# Patient Record
Sex: Female | Born: 1935 | Race: Black or African American | Hispanic: No | Marital: Married | Smoking: Never smoker
Health system: Southern US, Community
[De-identification: ages and names within clinical notes are randomized; demographics above are authoritative.]

## PROBLEM LIST (undated history)

## (undated) DIAGNOSIS — N183 Chronic kidney disease, stage 3 unspecified: Secondary | ICD-10-CM

## (undated) DIAGNOSIS — M1990 Unspecified osteoarthritis, unspecified site: Secondary | ICD-10-CM

## (undated) DIAGNOSIS — R011 Cardiac murmur, unspecified: Secondary | ICD-10-CM

## (undated) DIAGNOSIS — E119 Type 2 diabetes mellitus without complications: Secondary | ICD-10-CM

## (undated) DIAGNOSIS — C801 Malignant (primary) neoplasm, unspecified: Secondary | ICD-10-CM

## (undated) DIAGNOSIS — C499 Malignant neoplasm of connective and soft tissue, unspecified: Secondary | ICD-10-CM

## (undated) DIAGNOSIS — G4730 Sleep apnea, unspecified: Secondary | ICD-10-CM

## (undated) DIAGNOSIS — F0390 Unspecified dementia without behavioral disturbance: Secondary | ICD-10-CM

## (undated) DIAGNOSIS — R600 Localized edema: Secondary | ICD-10-CM

## (undated) DIAGNOSIS — I10 Essential (primary) hypertension: Secondary | ICD-10-CM

## (undated) DIAGNOSIS — E785 Hyperlipidemia, unspecified: Secondary | ICD-10-CM

## (undated) HISTORY — PX: CATARACT EXTRACTION: SUR2

## (undated) HISTORY — PX: COLONOSCOPY: SHX174

## (undated) HISTORY — PX: ABDOMINAL HYSTERECTOMY: SHX81

## (undated) HISTORY — PX: JOINT REPLACEMENT: SHX530

## (undated) HISTORY — PX: TONSILLECTOMY: SUR1361

## (undated) HISTORY — PX: CHOLECYSTECTOMY: SHX55

---

## 2004-05-11 ENCOUNTER — Ambulatory Visit: Payer: Self-pay | Admitting: Internal Medicine

## 2004-08-11 ENCOUNTER — Ambulatory Visit: Payer: Self-pay | Admitting: Gastroenterology

## 2004-11-08 ENCOUNTER — Ambulatory Visit: Payer: Self-pay | Admitting: Internal Medicine

## 2004-12-01 ENCOUNTER — Ambulatory Visit: Payer: Self-pay | Admitting: Internal Medicine

## 2005-06-14 ENCOUNTER — Ambulatory Visit: Payer: Self-pay | Admitting: Internal Medicine

## 2005-06-20 ENCOUNTER — Ambulatory Visit: Payer: Self-pay | Admitting: Internal Medicine

## 2005-07-06 ENCOUNTER — Other Ambulatory Visit: Payer: Self-pay

## 2005-07-06 ENCOUNTER — Ambulatory Visit: Payer: Self-pay | Admitting: General Surgery

## 2005-07-08 ENCOUNTER — Emergency Department: Payer: Self-pay | Admitting: General Practice

## 2005-07-13 ENCOUNTER — Ambulatory Visit: Payer: Self-pay | Admitting: General Surgery

## 2005-07-28 ENCOUNTER — Ambulatory Visit: Payer: Self-pay | Admitting: Internal Medicine

## 2005-08-07 ENCOUNTER — Ambulatory Visit: Payer: Self-pay | Admitting: Internal Medicine

## 2005-08-30 ENCOUNTER — Ambulatory Visit: Payer: Self-pay | Admitting: Internal Medicine

## 2005-09-30 ENCOUNTER — Ambulatory Visit: Payer: Self-pay | Admitting: Internal Medicine

## 2005-10-30 ENCOUNTER — Ambulatory Visit: Payer: Self-pay | Admitting: Internal Medicine

## 2006-01-05 ENCOUNTER — Ambulatory Visit: Payer: Self-pay | Admitting: Internal Medicine

## 2006-01-08 ENCOUNTER — Ambulatory Visit: Payer: Self-pay | Admitting: General Surgery

## 2006-01-30 ENCOUNTER — Ambulatory Visit: Payer: Self-pay | Admitting: Internal Medicine

## 2006-05-01 ENCOUNTER — Ambulatory Visit: Payer: Self-pay | Admitting: Internal Medicine

## 2006-05-04 ENCOUNTER — Ambulatory Visit: Payer: Self-pay | Admitting: Internal Medicine

## 2006-05-31 ENCOUNTER — Ambulatory Visit: Payer: Self-pay | Admitting: Internal Medicine

## 2006-07-25 ENCOUNTER — Ambulatory Visit: Payer: Self-pay | Admitting: General Surgery

## 2006-10-31 ENCOUNTER — Ambulatory Visit: Payer: Self-pay | Admitting: Internal Medicine

## 2006-11-19 ENCOUNTER — Ambulatory Visit: Payer: Self-pay | Admitting: Radiation Oncology

## 2006-12-01 ENCOUNTER — Ambulatory Visit: Payer: Self-pay | Admitting: Radiation Oncology

## 2006-12-01 ENCOUNTER — Ambulatory Visit: Payer: Self-pay | Admitting: Internal Medicine

## 2007-02-13 ENCOUNTER — Ambulatory Visit: Payer: Self-pay | Admitting: General Surgery

## 2007-05-01 ENCOUNTER — Ambulatory Visit: Payer: Self-pay | Admitting: Internal Medicine

## 2007-05-27 ENCOUNTER — Ambulatory Visit: Payer: Self-pay | Admitting: Internal Medicine

## 2007-05-31 ENCOUNTER — Ambulatory Visit: Payer: Self-pay | Admitting: Internal Medicine

## 2007-08-26 ENCOUNTER — Ambulatory Visit: Payer: Self-pay | Admitting: General Surgery

## 2007-10-31 ENCOUNTER — Ambulatory Visit: Payer: Self-pay | Admitting: Internal Medicine

## 2007-11-25 ENCOUNTER — Ambulatory Visit: Payer: Self-pay | Admitting: Internal Medicine

## 2007-12-01 ENCOUNTER — Ambulatory Visit: Payer: Self-pay | Admitting: Internal Medicine

## 2007-12-13 ENCOUNTER — Ambulatory Visit: Payer: Self-pay | Admitting: Unknown Physician Specialty

## 2008-01-30 ENCOUNTER — Ambulatory Visit: Payer: Self-pay | Admitting: Internal Medicine

## 2008-01-31 ENCOUNTER — Ambulatory Visit: Payer: Self-pay | Admitting: Internal Medicine

## 2008-03-02 ENCOUNTER — Ambulatory Visit: Payer: Self-pay | Admitting: Internal Medicine

## 2008-04-09 ENCOUNTER — Ambulatory Visit: Payer: Self-pay | Admitting: Internal Medicine

## 2008-05-30 ENCOUNTER — Ambulatory Visit: Payer: Self-pay | Admitting: Internal Medicine

## 2008-06-03 ENCOUNTER — Ambulatory Visit: Payer: Self-pay | Admitting: Internal Medicine

## 2008-06-30 ENCOUNTER — Ambulatory Visit: Payer: Self-pay | Admitting: Internal Medicine

## 2008-09-04 ENCOUNTER — Ambulatory Visit: Payer: Self-pay | Admitting: Internal Medicine

## 2008-11-30 ENCOUNTER — Ambulatory Visit: Payer: Self-pay | Admitting: Internal Medicine

## 2008-12-07 ENCOUNTER — Ambulatory Visit: Payer: Self-pay | Admitting: Internal Medicine

## 2008-12-30 ENCOUNTER — Ambulatory Visit: Payer: Self-pay | Admitting: Internal Medicine

## 2009-04-13 ENCOUNTER — Ambulatory Visit: Payer: Self-pay | Admitting: Internal Medicine

## 2009-05-30 ENCOUNTER — Ambulatory Visit: Payer: Self-pay | Admitting: Internal Medicine

## 2009-06-07 ENCOUNTER — Ambulatory Visit: Payer: Self-pay | Admitting: Internal Medicine

## 2009-06-30 ENCOUNTER — Ambulatory Visit: Payer: Self-pay | Admitting: Internal Medicine

## 2010-05-25 ENCOUNTER — Ambulatory Visit: Payer: Self-pay | Admitting: Internal Medicine

## 2010-06-14 ENCOUNTER — Ambulatory Visit: Payer: Self-pay | Admitting: Internal Medicine

## 2011-05-11 ENCOUNTER — Ambulatory Visit: Payer: Self-pay | Admitting: Internal Medicine

## 2012-08-12 ENCOUNTER — Ambulatory Visit: Payer: Self-pay | Admitting: Internal Medicine

## 2012-08-19 ENCOUNTER — Ambulatory Visit: Payer: Self-pay | Admitting: Internal Medicine

## 2013-01-07 ENCOUNTER — Ambulatory Visit: Payer: Self-pay | Admitting: Surgery

## 2013-01-09 ENCOUNTER — Other Ambulatory Visit: Payer: Self-pay | Admitting: Surgery

## 2013-01-09 ENCOUNTER — Ambulatory Visit: Payer: Self-pay | Admitting: Radiation Oncology

## 2013-01-09 DIAGNOSIS — R928 Other abnormal and inconclusive findings on diagnostic imaging of breast: Secondary | ICD-10-CM

## 2013-01-16 ENCOUNTER — Ambulatory Visit: Payer: Self-pay | Admitting: Radiation Oncology

## 2013-01-21 ENCOUNTER — Other Ambulatory Visit: Payer: Self-pay

## 2013-01-28 ENCOUNTER — Inpatient Hospital Stay: Admission: RE | Admit: 2013-01-28 | Payer: Self-pay | Source: Ambulatory Visit

## 2013-01-30 ENCOUNTER — Ambulatory Visit: Payer: Self-pay | Admitting: Radiation Oncology

## 2013-02-06 ENCOUNTER — Ambulatory Visit
Admission: RE | Admit: 2013-02-06 | Discharge: 2013-02-06 | Disposition: A | Discharge status: Home / Self Care | Payer: Medicare Other | Source: Ambulatory Visit | Attending: Surgery | Admitting: Surgery

## 2013-02-06 DIAGNOSIS — R928 Other abnormal and inconclusive findings on diagnostic imaging of breast: Secondary | ICD-10-CM

## 2013-02-14 ENCOUNTER — Ambulatory Visit: Payer: Self-pay | Admitting: Internal Medicine

## 2013-07-23 ENCOUNTER — Ambulatory Visit: Payer: Self-pay | Admitting: Internal Medicine

## 2014-01-06 ENCOUNTER — Ambulatory Visit: Payer: Self-pay | Admitting: Internal Medicine

## 2014-03-10 ENCOUNTER — Ambulatory Visit: Payer: Self-pay | Admitting: Internal Medicine

## 2014-03-11 ENCOUNTER — Other Ambulatory Visit: Payer: Self-pay | Admitting: Surgery

## 2014-03-11 DIAGNOSIS — R928 Other abnormal and inconclusive findings on diagnostic imaging of breast: Secondary | ICD-10-CM

## 2014-03-11 DIAGNOSIS — C50912 Malignant neoplasm of unspecified site of left female breast: Secondary | ICD-10-CM

## 2014-03-19 ENCOUNTER — Ambulatory Visit
Admission: RE | Admit: 2014-03-19 | Discharge: 2014-03-19 | Disposition: A | Discharge status: Home / Self Care | Payer: Medicare Other | Source: Ambulatory Visit | Attending: Surgery | Admitting: Surgery

## 2014-03-19 DIAGNOSIS — R928 Other abnormal and inconclusive findings on diagnostic imaging of breast: Secondary | ICD-10-CM

## 2014-03-19 MED ORDER — GADOBENATE DIMEGLUMINE 529 MG/ML IV SOLN
10.0000 mL | Freq: Once | INTRAVENOUS | Status: AC | PRN
  Administered 2014-03-19: 10 mL via INTRAVENOUS

## 2014-03-24 ENCOUNTER — Other Ambulatory Visit: Payer: Medicaid Other

## 2014-03-31 ENCOUNTER — Ambulatory Visit
Admit: 2014-03-31 | Disposition: A | Discharge status: Home / Self Care | Payer: Self-pay | Attending: Internal Medicine | Admitting: Internal Medicine

## 2014-05-22 NOTE — Consult Note (Signed)
Reason for Visit: This 81 year old Female patient presents to the clinic for initial evaluation of  possibly recurrent breast cancer .   Referred by Dr. Burt Knack.  Diagnosis:  Chief Complaint/Diagnosis   81 year old female status post recent therapy to her left breast for a T1 lesion back in 2007 now with thickening of the left breast and mammogram concerning for recurrent disease.  HPI   patient is a 81 year old female treated back in 2007 for a T1 C. infiltrating ductal carcinoma of the left breast with adjuvant radiation therapy and Femara. I had last seen her in followup in 2009. She presented in July of 2007 complaining of swelling of her left breast. This was rather hard and thickened and mammogram was performed showing BI-RADS category 4 with diffuse increased density and contraction of the left breast along with skin skin thickening along the in 3 left breast findings were concerning for recurrent disease. She had back also in July skin biopsies as well as core needle biopsies of the left breast all negative for malignancy. She scheduled now for an MRI of the next several weeks of the left breast. She seen today for radiation oncology opinion. Breast is nontender to the patient she is asymptomatic. She did have a repeat mammogram in January 07, 2013 again showing increased density as well as coarse heterogeneous calcifications in the inner upper left breast spanning 5 mm. Findings were similar to the mammograms 5 months prior.  Past Hx:    breast ca:    Hypertension:    Diabetes:    Hysterectomy - Partial: 1983   Cataract Extraction: Right Eye, 2007   Cholecystectomy: 1987   Tonsillectomy: 1960   Hip Replacement - Right: 2004  Past, Family and Social History:  Past Medical History positive   Cardiovascular hypertension   Endocrine diabetes mellitus   Past Surgical History cholecystectomy; tonsillectomy, cataract extraction, partial hysterectomy, hip replacement on the  right   Family History positive   Family History Comments adult onset diabetes   Social History noncontributory   Additional Past Medical and Surgical History accompanied by daughter today   Allergies:   Penicillin: Rash  Home Meds:  Home Medications: Medication Instructions Status  lisinopril tablet 40 mg 1 tab(s) orally once a day  Active  glipiZIDE tablet 10 mg 2 tab(s)  2 times a day Active  Lantus 100 units/mL subcutaneous solution 18 unit(s) subcutaneous once a day Active  Lasix 80 mg oral tablet 1 tab(s) orally once a day Active  metFORMIN 1000 mg oral tablet 1 tab(s) orally 2 times a day Active  NexIUM 40 mg oral delayed release capsule 1 cap(s) orally once a day Active  ? Insulin Pen 90 unit(s) subcutaneous once a day Active  potassium chloride 10 mEq oral tablet, extended release tab(s) orally once a day Active  simvastatin 80 mg oral tablet 1 tab(s) orally once a day (at bedtime) Active  terazosin 10 mg oral capsule 1 cap(s) orally once a day (at bedtime) Active  Vitamin C 500 mg oral tablet 1 tab(s) orally once a day Active  Vitamin D3 400 intl units oral capsule 1 cap(s) orally once a day Active  Aricept 23 mg oral tablet 1 tab(s) orally once a day (at bedtime) Active  Benicar 40 mg oral tablet 1 tab(s) orally once a day Active  busPIRone 15 mg oral tablet 1 tab(s) orally 2 times a day, As Needed for anxiety. Active  Bystolic 10 mg oral tablet 1 tab(s) orally once  a day Active  cyanocobalamin 500 mcg oral tablet 1 tab(s) orally once a day Active  econazole topical 1% topical cream Apply topically to affected area 2 times a day Active  ferrous sulfate 324 mg oral delayed release tablet 1 tab(s) orally once a day Active  fexofenadine 180 mg oral tablet 1 tab(s) orally once a day Active   Review of Systems:  General negative   Performance Status (ECOG) 0   Skin see HPI   Breast see HPI   Ophthalmologic negative   ENMT negative   Respiratory and Thorax  negative   Cardiovascular negative   Gastrointestinal negative   Genitourinary negative   Musculoskeletal negative   Neurological negative   Psychiatric negative   Hematology/Lymphatics negative   Endocrine negative   Allergic/Immunologic negative   Review of Systems   review of systems obtained from nurses notes  Nursing Notes:  Nursing Vital Signs and Chemo Nursing Nursing Notes: *CC Vital Signs Flowsheet:   18-Dec-14 08:48  Temp Temperature 96.5  Pulse Pulse 56  Respirations Respirations 20  SBP SBP 13  DBP DBP 79  Pain Scale (0-10)  0  Current Weight (kg) (kg) 103.3  Height (cm) centimeters 164.6  BSA (m2) 2   Physical Exam:  General/Skin/HEENT:  General normal   Eyes normal   ENMT normal   Head and Neck normal   Additional PE well-developed obese female in NAD. Lungs are clear to A&P cardiac examination shows regular rate and rhythm. Left breast is markedly thickened. There is also retraction towards her scar site. No obvious skin ulceration or nodularity the skin is appreciated. Right breast is free of dominant mass or nodularity into position examined. No axillary or supraclavicular adenopathy is appreciated. Lungs are clear to A&P cardiac examination shows regular rate and rhythm.   Breasts/Resp/CV/GI/GU:  Respiratory and Thorax normal   Cardiovascular normal   Gastrointestinal normal   Genitourinary normal   MS/Neuro/Psych/Lymph:  Musculoskeletal normal   Neurological normal   Lymphatics normal   Other Results:  Radiology Results: LabUnknown:    14-Jul-14 09:28, Digital Diagnostic Mammogram Bilateral  PACS Image     09-Dec-14 09:06, Digital Unilateral Left Breast  PACS [place]:    14-Jul-14 09:28, Digital Diagnostic Mammogram Bilateral  Digital Diagnostic Mammogram Bilateral   REASON FOR EXAM:    LT BRST MASS 12 OCLOCK BEHIND NIPPLE  AND YRLY  COMMENTS:       PROCEDURE: MAM - MAM DGTL DIAGNOSTIC MAMMO W/CAD  - Aug 12 2012  9:28AM           RESULT:     Comparison: 05/11/2011, 06/14/2010, 04/13/2009, 08/26/2007.    Findings:    The breast tissue is heterogeneously dense, which may lower the     sensitivity of mammography. Two scar markers are present along the   superomedial left breast. The breast tissue throughout the left breast   appears relatively increased in density and more contracted than the   prior studies. This involves the tissue from the anterior to posterior   depths. The left breast appears to have decreased in size from prior   studies. There is marked skin thickening anteriorly along the left breast   which appears increased from prior studies. Spot compression   magnification views are performed of the anterior aspect of the left   breast at the site of reported palpable abnormality. These images   demonstrate the large area of dystrophic calcification which has been  seen on prior studies. The dystrophic calcifications have increased in   size. There is marked skin thickening on the spot compression   magnification views, as well. There is some motion artifact on the CC and   MLO views likelyrelated to image acquisition related to the very dense   breast tissue. There is a very small cluster of microcalcifications in     the superior medial left breast.    No suspicious masses or calcifications are identified in the right breast.    Real-time ultrasound was performed of the superior left breast anteriorly   at the site of reported palpable abnormality. There is marked thickening   of the tissues in this region. There is diffuse heterogeneity of these   tissues including shadowing. This makes evaluation of the mid and   posterior depths very limited. There is an ill-defined area of complex   thickening superficially in this region which measures 4.4 x 2.9 x 1.4   cm.     IMPRESSION:    1. BI-RADS: Category 4 - Suspicious Abnormality.  2. Surgical consultation is  recommended. The tissue in the left breast   appears diffusely increased in density and somewhat contracted. The left   breast appears globally smaller than prior studies. Additionally, skin   thickening along the anterior left breast appears increased from prior   studies. There is marked heterogeneity of these tissues on ultrasound   with an indeterminate large area of mass-like thickening superficially.   The findings are concerning for a diffusely infiltrative malignancy.   Given the relative diffuse nature and the history of radiation, further   evaluation with MRI is suggested.  3. There is a small cluster of indeterminate microcalcifications in the   superior medial left breast for which spot compressionmagnification   views are suggested.   4.     Spot compression magnification views of the central and posterior   depths of the left breast are also suggested. However, the assessment of   diffuse abnormality in the left breast will remain the same.  BREAST COMPOSITION:  The breast composition is HETEROGENEOUSLY DENSE   (glandular tissue is 51-75%). This may decrease the sensitivity of   mammography.     Thank you for this opportunity to contribute to the care of your patient.    A NEGATIVE MAMMOGRAM REPORT DOES NOT PRECLUDE BIOPSY OR OTHER EVALUATION   OF A CLINICALLY PALPABLE OR OTHERWISE SUSPICIOUS MASS OR LESION. BREAST   CANCER MAY NOT BE DETECTED BY MAMMOGRAPHY IN UP TO 10% OF CASES.         Verified By: Gregor Hams, M.D., MD    09-Dec-14 09:06, Digital Unilateral Left Breast  Digital Unilateral Left Breast   REASON FOR EXAM:    LT BR MICROCALCS FU  COMMENTS:       PROCEDURE: MAM - MAM DGTL UNI MAM LT BREAST W/CAD  - Jan 07 2013  9:06AM     CLINICAL DATA:  Followup left breast calcifications. Patient is  status post benign left breast biopsy.    EXAM:  DIGITAL DIAGNOSTIC  LEFT MAMMOGRAM WITH CAD    ULTRASOUND LEFT BREAST    COMPARISON:  Multiple priors  ACR  Breast Density Category c: The breast tissue is heterogeneously  dense, which may obscure small masses.    FINDINGS:  Post lumpectomy changes of the left breast. There is significant  skin thickening. Overall increased density is seen in the  retroareolar left breast, similar to  the prior examination. No  masses or new architectural distortion. Coarse heterogeneous  calcifications in the inner upper left breast span up to 5 mm. No  definite change is seen in these calcifications from the prior  examination in July 2014.    Mammographic images were processed with CAD.    On physical exam,the entire left breast is hard, particularly in the  retroareolar region.    Ultrasound of the retroareolar left breast again demonstrates  significant skin thickening and an irregular mixed echogenicity area  in the anterior retroareolar left breast, measuring at least up to  4.5 cm. There is some associated posterior acoustic shadowing. Due  to the shadowing, evaluation of the more posterior retroareolar  breast is limited.     IMPRESSION:  1. Findings are similar to the prior examination, with marked skin  thickening and diffusely increased breastdensity. Abnormal mixed  echogenicity area spanning the entire anterior retroareolar breast  is seen sonographically.  2. No change in the inner upper quadrant left breast calcifications.  RECOMMENDATION:  Breast MRI is recommended for further evaluation.    I have discussed the findings and recommendations with the patient.  Results were also provided in writing at the conclusion of the  visit.    BI-RADS CATEGORY  0: Incomplete. Need additional imaging evaluation  and/or prior mammograms for comparison.      Electronically Signed    By: Donavan Burnet M.D.    On: 01/07/2013 17:16     Verified By: Maryella Shivers, M.D.,   Relevent Results:   Relevant Scans and Labs serial mammograms were reviewed.   Assessment and  Plan: Impression:   possible recurrent breast cancer in the left breast status post radiation therapy for a T1 lesion in 8446 in 81 year old female Plan:   the stomach to review the results of her MRI scan and followup with that. Also schedule a followup appointment shortly after her MRI with Dr. Ma Hillock for his opinion. Patient at some point may need a toilet mastectomy on the left side. She is asymptomatic at this time and she is demonstrating no significant complaints related to the thickening of her left breast. PET CT scan also may be of some benefit in the future should we decide to followup with complete staging workup. I have discussed the case with Dr. Ma Hillock and have phoned in to Dr. Burt Knack to discuss my findings with him. Patient will followup right after her MRI in my office.  I would like to take this opportunity to thank you for allowing me to continue to participate in this patient's care.  CC Referral:  cc: Dr. Burt Knack, Dr. Ramonita Lab   Electronic Signatures: Baruch Gouty, Roda Shutters (MD)  (Signed 18-Dec-14 14:53)  Authored: HPI, Diagnosis, Past Hx, PFSH, Allergies, Home Meds, ROS, Nursing Notes, Physical Exam, Other Results, Relevent Results, Encounter Assessment and Plan, CC Referring Physician   Last Updated: 18-Dec-14 14:53 by Armstead Peaks (MD)

## 2014-07-02 NOTE — Telephone Encounter (Signed)
This encounter was created in error - please disregard.

## 2014-10-12 ENCOUNTER — Telehealth: Payer: Self-pay

## 2014-10-12 NOTE — Telephone Encounter (Signed)
After speaking with Jo Bird who confirmed with Dr. Ma Hillock, Pt has transferred all of her care to [facility] and is currently under the care of Dr. Caleen Jobs, Dr. Beverley Fiedler, and Dr. Kateri Mc.  This was confirmed by seeing records in [place]. No further need for work-up as she is following up at the [facility].

## 2014-10-12 NOTE — Telephone Encounter (Signed)
-----   Message from Otilio Jefferson sent at 07/01/2014  1:03 PM EDT ----- Regarding: Mammogram Needed Office visit: 03/06/2014  Pt will need mammogram scheduled in 10/2014 and then f/u with Dr. Burt Knack afterwards. Placed on recall list by [facility] on 10/16/13.

## 2014-10-12 NOTE — Telephone Encounter (Signed)
After reading last office notes in chart and last Office visit from Dr. Ma Hillock. I am not exactly sure on what follow-up is needed for this patient. Will speak with nurse navigator and Dr. Burt Knack to provide some direction on this matter.

## 2014-11-16 ENCOUNTER — Telehealth: Payer: Self-pay | Admitting: Surgery

## 2014-11-16 NOTE — Telephone Encounter (Signed)
Patient showed up on recall list in old system for repeat mammo, per daughter she is going through [facility] and still being followed by them, i explained to daughter our records show she needs to have mammo repeated at this time and talk with her [place] and let them know, See if they recommended her having it and if so she can call [facility] back to scheduled or go through [place]. They will be in touch.

## 2015-07-21 ENCOUNTER — Encounter: Payer: Self-pay | Admitting: Gynecology

## 2015-07-21 ENCOUNTER — Ambulatory Visit
Admission: EM | Admit: 2015-07-21 | Discharge: 2015-07-21 | Disposition: A | Discharge status: Home / Self Care | Payer: Medicare Other | Attending: Family Medicine | Admitting: Family Medicine

## 2015-07-21 DIAGNOSIS — H6123 Impacted cerumen, bilateral: Secondary | ICD-10-CM

## 2015-07-21 DIAGNOSIS — H6502 Acute serous otitis media, left ear: Secondary | ICD-10-CM | POA: Diagnosis not present

## 2015-07-21 HISTORY — DX: Unspecified dementia, unspecified severity, without behavioral disturbance, psychotic disturbance, mood disturbance, and anxiety: F03.90

## 2015-07-21 HISTORY — DX: Hypercalcemia: E83.52

## 2015-07-21 HISTORY — DX: Chronic kidney disease, stage 3 unspecified: N18.30

## 2015-07-21 HISTORY — DX: Type 2 diabetes mellitus without complications: E11.9

## 2015-07-21 HISTORY — DX: Malignant neoplasm of connective and soft tissue, unspecified: C49.9

## 2015-07-21 HISTORY — DX: Hyperlipidemia, unspecified: E78.5

## 2015-07-21 HISTORY — DX: Chronic kidney disease, stage 3 (moderate): N18.3

## 2015-07-21 HISTORY — DX: Cardiac murmur, unspecified: R01.1

## 2015-07-21 HISTORY — DX: Malignant (primary) neoplasm, unspecified: C80.1

## 2015-07-21 HISTORY — DX: Unspecified osteoarthritis, unspecified site: M19.90

## 2015-07-21 HISTORY — DX: Essential (primary) hypertension: I10

## 2015-07-21 HISTORY — DX: Sleep apnea, unspecified: G47.30

## 2015-07-21 HISTORY — DX: Localized edema: R60.0

## 2015-07-21 MED ORDER — DOXYCYCLINE HYCLATE 100 MG PO TABS: 100.0000 mg | ORAL_TABLET | Freq: Two times a day (BID) | ORAL | Status: DC

## 2015-07-21 NOTE — ED Provider Notes (Signed)
CSN: [id number]     Arrival date & time 07/21/15  1504 History   First MD Initiated Contact with Patient 07/21/15 1556     Chief Complaint  Patient presents with  . Tinnitus   (Consider location/radiation/quality/duration/timing/severity/associated sxs/prior Treatment) HPI Comments: 82 yo female with a 1 week h/o bilateral ear pressure worse on the left side. Denies any fevers, chills, drainage. States had a recent viral cold one week ago.   The history is provided by the patient.    Past Medical History  Diagnosis Date  . Hypertension   . Arthritis   . Hyperlipemia   . Leg edema   . Sleep apnea   . Chronic kidney disease (CKD), stage III (moderate)   . Hypercalcemia   . Cancer ([place])     left breast  . 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 Diabetes mellitus without complication ([place])   . Heart murmur   . Dementia    Past Surgical History  Procedure Laterality Date  . Abdominal hysterectomy    . Cholecystectomy    . Tonsillectomy    . Joint replacement    . Cataract extraction    . Colonoscopy     Family History  Problem Relation Age of Onset  . Heart failure Mother   . Diabetes Sister    Social History  Substance Use Topics  . Smoking status: Never Smoker   . Smokeless tobacco: None  . Alcohol Use: No   OB History    No data available     Review of Systems  Allergies  Penicillins and Pioglitazone  Home Medications   Prior to Admission medications   Medication Sig Start Date End Date Taking? Authorizing Provider  busPIRone (BUSPAR) 15 MG tablet Take 15 mg by mouth 3 (three) times daily.   Yes Historical Provider, MD  cholecalciferol (VITAMIN D) 400 units TABS tablet Take 400 Units by mouth.   Yes Historical Provider, MD  cyanocobalamin 500 MCG tablet Take 500 mcg by mouth daily.   Yes Historical Provider, MD  esomeprazole (NEXIUM) 40 MG capsule Take 40 mg by mouth daily at 12 noon.   Yes Historical Provider, MD  ferrous sulfate 325 (65 FE) MG tablet  Take 325 mg by mouth daily with breakfast.   Yes Historical Provider, MD  fexofenadine (ALLEGRA) 180 MG tablet Take 180 mg by mouth daily.   Yes Historical Provider, MD  furosemide (LASIX) 80 MG tablet Take 80 mg by mouth.   Yes Historical Provider, MD  glipiZIDE (GLUCOTROL) 10 MG tablet Take 10 mg by mouth daily before breakfast.   Yes Historical Provider, MD  Glucose Blood (BLOOD GLUCOSE TEST STRIPS) STRP by In Vitro route.   Yes Historical Provider, MD  Insulin Glargine (LANTUS SOLOSTAR) 100 UNIT/ML Solostar Pen Inject into the skin daily at 10 pm.   Yes Historical Provider, MD  lisinopril (PRINIVIL,ZESTRIL) 40 MG tablet Take 40 mg by mouth daily.   Yes Historical Provider, MD  memantine (NAMENDA) 5 MG tablet Take 5 mg by mouth 2 (two) times daily.   Yes Historical Provider, MD  nebivolol (BYSTOLIC) 10 MG tablet Take 10 mg by mouth daily.   Yes Historical Provider, MD  olmesartan (BENICAR) 40 MG tablet Take 40 mg by mouth daily.   Yes Historical Provider, MD  potassium chloride (K-DUR,KLOR-CON) 10 MEQ tablet Take 10 mEq by mouth 2 (two) times daily.   Yes Historical Provider, MD  simvastatin (ZOCOR) 80 MG tablet Take 80 mg  by mouth daily.   Yes Historical Provider, MD  Sodium Hypochlorite 0.0125 % SOLN Apply topically.   Yes Historical Provider, MD  terazosin (HYTRIN) 10 MG capsule Take 10 mg by mouth at bedtime.   Yes Historical Provider, MD  doxycycline (VIBRA-TABS) 100 MG tablet Take 1 tablet (100 mg total) by mouth 2 (two) times daily. 07/21/15   Norval Gable, MD   Meds Ordered and Administered this Visit  Medications - No data to display  BP 147/70 mmHg  Pulse 61  Temp(Src) 98.7 F (37.1 C) (Oral)  Resp 16  Ht 5\' 5"  (1.651 m)  Wt 230 lb (104.327 kg)  BMI 38.27 kg/m2  SpO2 98% No data found.   Physical Exam  Constitutional: She appears well-developed and well-nourished. No distress.  HENT:  Head: Normocephalic and atraumatic.  Right Ear: Tympanic membrane, external ear and  ear canal normal.  Left Ear: External ear and ear canal normal. Tympanic membrane is erythematous. A middle ear effusion is present.  Nose: No mucosal edema, rhinorrhea, nose lacerations, sinus tenderness, nasal deformity, septal deviation or nasal septal hematoma. No epistaxis.  No foreign bodies.  Mouth/Throat: Uvula is midline, oropharynx is clear and moist and mucous membranes are normal. No oropharyngeal exudate.  Cerumen impaction bilaterally; TMs visualized after cerumen disimpaction  Eyes: Conjunctivae and EOM are normal. Pupils are equal, round, and reactive to light. Right eye exhibits no discharge. Left eye exhibits no discharge. No scleral icterus.  Neck: Normal range of motion. Neck supple. No thyromegaly present.  Cardiovascular: Normal rate, regular rhythm and normal heart sounds.   Pulmonary/Chest: Effort normal and breath sounds normal. No respiratory distress. She has no wheezes. She has no rales.  Lymphadenopathy:    She has no cervical adenopathy.  Skin: She is not diaphoretic.  Nursing note and vitals reviewed.   ED Course  Procedures (including critical care time)  Labs Review Labs Reviewed - No data to display  Imaging Review No results found.   Visual Acuity Review  Right Eye Distance:   Left Eye Distance:   Bilateral Distance:    Right Eye Near:   Left Eye Near:    Bilateral Near:         MDM   1. Cerumen impaction, bilateral   2. Acute serous otitis media of left ear, recurrence not specified    Discharge Medication List as of 07/21/2015  4:26 PM    START taking these medications   Details  doxycycline (VIBRA-TABS) 100 MG tablet Take 1 tablet (100 mg total) by mouth 2 (two) times daily., Starting 07/21/2015, Until Discontinued, Normal       1. diagnosis reviewed with patient 2. rx as per orders above; reviewed possible side effects, interactions, risks and benefits  3. Recommend supportive treatment with otc analgesics 4. Ear lavage per  RN with resolution of cerumen impaction bilaterally 5. Follow-up prn if symptoms worsen or don't improve    Norval Gable, MD 07/21/15 1843

## 2015-07-21 NOTE — ED Notes (Signed)
Patient c/o ringing in left ear x 1 week.

## 2015-07-21 NOTE — Discharge Instructions (Signed)
Otitis Media With Effusion Otitis media with effusion is the presence of fluid in the middle ear. This is a common problem in children, which often follows ear infections. It may be present for weeks or longer after the infection. Unlike an acute ear infection, otitis media with effusion refers only to fluid behind the ear drum and not infection. Children with repeated ear and sinus infections and allergy problems are the most likely to get otitis media with effusion. CAUSES  The most frequent cause of the fluid buildup is dysfunction of the eustachian tubes. These are the tubes that drain fluid in the ears to the back of the nose (nasopharynx). SYMPTOMS   The main symptom of this condition is hearing loss. As a result, you or your child may:  Listen to the TV at a loud volume.  Not respond to questions.  Ask "what" often when spoken to.  Mistake or confuse one sound or word for another.  There may be a sensation of fullness or pressure but usually not pain. DIAGNOSIS   Your health care provider will diagnose this condition by examining you or your child's ears.  Your health care provider may test the pressure in you or your child's ear with a tympanometer.  A hearing test may be conducted if the problem persists. TREATMENT   Treatment depends on the duration and the effects of the effusion.  Antibiotics, decongestants, nose drops, and cortisone-type drugs (tablets or nasal spray) may not be helpful.  Children with persistent ear effusions may have delayed language or behavioral problems. Children at risk for developmental delays in hearing, learning, and speech may require referral to a specialist earlier than children not at risk.  You or your child's health care provider may suggest a referral to an ear, nose, and throat surgeon for treatment. The following may help restore normal hearing:  Drainage of fluid.  Placement of ear tubes (tympanostomy tubes).  Removal of adenoids  (adenoidectomy). HOME CARE INSTRUCTIONS   Avoid secondhand smoke.  Infants who are breastfed are less likely to have this condition.  Avoid feeding infants while they are lying flat.  Avoid known environmental allergens.  Avoid people who are sick. SEEK MEDICAL CARE IF:   Hearing is not better in 3 months.  Hearing is worse.  Ear pain.  Drainage from the ear.  Dizziness. MAKE SURE YOU:   Understand these instructions.  Will watch your condition.  Will get help right away if you are not doing well or get worse.   This information is not intended to replace advice given to you by your health care provider. Make sure you discuss any questions you have with your health care provider.   Document Released: 02/24/2004 Document Revised: 02/06/2014 Document Reviewed: 08/13/2012 Elsevier Interactive Patient Education 2016 [employer].  Otitis Media, Adult Otitis media is redness, soreness, and inflammation of the middle ear. Otitis media may be caused by allergies or, most commonly, by infection. Often it occurs as a complication of the common cold. SIGNS AND SYMPTOMS Symptoms of otitis media may include:  Earache.  Fever.  Ringing in your ear.  Headache.  Leakage of fluid from the ear. DIAGNOSIS To diagnose otitis media, your health care provider will examine your ear with an otoscope. This is an instrument that allows your health care provider to see into your ear in order to examine your eardrum. Your health care provider also will ask you questions about your symptoms. TREATMENT  Typically, otitis media resolves on  its own within 3-5 days. Your health care provider may prescribe medicine to ease your symptoms of pain. If otitis media does not resolve within 5 days or is recurrent, your health care provider may prescribe antibiotic medicines if he or she suspects that a bacterial infection is the cause. HOME CARE INSTRUCTIONS   If you were prescribed an antibiotic  medicine, finish it all even if you start to feel better.  Take medicines only as directed by your health care provider.  Keep all follow-up visits as directed by your health care provider. SEEK MEDICAL CARE IF:  You have otitis media only in one ear, or bleeding from your nose, or both.  You notice a lump on your neck.  You are not getting better in 3-5 days.  You feel worse instead of better. SEEK IMMEDIATE MEDICAL CARE IF:   You have pain that is not controlled with medicine.  You have swelling, redness, or pain around your ear or stiffness in your neck.  You notice that part of your face is paralyzed.  You notice that the bone behind your ear (mastoid) is tender when you touch it. MAKE SURE YOU:   Understand these instructions.  Will watch your condition.  Will get help right away if you are not doing well or get worse.   This information is not intended to replace advice given to you by your health care provider. Make sure you discuss any questions you have with your health care provider.   Document Released: 10/22/2003 Document Revised: 02/06/2014 Document Reviewed: 08/13/2012 Elsevier Interactive Patient Education 2016 [employer] The structures of the external ear canal secrete a waxy substance known as cerumen. Excess cerumen can build up in the ear canal, causing a condition known as cerumen impaction. Cerumen impaction can cause ear pain and disrupt the function of the ear. The rate of cerumen production differs for each individual. In certain individuals, the configuration of the ear canal may decrease his or her ability to naturally remove cerumen. CAUSES Cerumen impaction is caused by excessive cerumen production or buildup. RISK FACTORS  Frequent use of swabs to clean ears.  Having narrow ear canals.  Having eczema.  Being dehydrated. SIGNS AND SYMPTOMS  Diminished hearing.  Ear drainage.  Ear pain.  Ear  itch. TREATMENT Treatment may involve:  Over-the-counter or prescription ear drops to soften the cerumen.  Removal of cerumen by a health care provider. This may be done with:  Irrigation with warm water. This is the most common method of removal.  Ear curettes and other instruments.  Surgery. This may be done in severe cases. HOME CARE INSTRUCTIONS  Take medicines only as directed by your health care provider.  Do not insert objects into the ear with the intent of cleaning the ear. PREVENTION  Do not insert objects into the ear, even with the intent of cleaning the ear. Removing cerumen as a part of normal hygiene is not necessary, and the use of swabs in the ear canal is not recommended.  Drink enough water to keep your urine clear or pale yellow.  Control your eczema if you have it. SEEK MEDICAL CARE IF:  You develop ear pain.  You develop bleeding from the ear.  The cerumen does not clear after you use ear drops as directed.   This information is not intended to replace advice given to you by your health care provider. Make sure you discuss any questions you have with your health care  provider.   Document Released: 02/24/2004 Document Revised: 02/06/2014 Document Reviewed: 09/02/2014 Elsevier Interactive Patient Education [employer].

## 2017-01-09 ENCOUNTER — Encounter: Payer: Self-pay | Admitting: Emergency Medicine

## 2017-01-09 ENCOUNTER — Emergency Department: Payer: Medicare Other

## 2017-01-09 ENCOUNTER — Inpatient Hospital Stay
Admission: EM | Admit: 2017-01-09 | Discharge: 2017-01-12 | DRG: 682 | Disposition: A | Discharge status: Skilled Nursing Facility | Payer: Medicare Other | Attending: Internal Medicine | Admitting: Internal Medicine

## 2017-01-09 ENCOUNTER — Other Ambulatory Visit: Payer: Self-pay

## 2017-01-09 DIAGNOSIS — N179 Acute kidney failure, unspecified: Secondary | ICD-10-CM | POA: Diagnosis not present

## 2017-01-09 DIAGNOSIS — Z66 Do not resuscitate: Secondary | ICD-10-CM | POA: Diagnosis present

## 2017-01-09 DIAGNOSIS — M1990 Unspecified osteoarthritis, unspecified site: Secondary | ICD-10-CM | POA: Diagnosis present

## 2017-01-09 DIAGNOSIS — R64 Cachexia: Secondary | ICD-10-CM | POA: Diagnosis present

## 2017-01-09 DIAGNOSIS — Z6832 Body mass index (BMI) 32.0-32.9, adult: Secondary | ICD-10-CM

## 2017-01-09 DIAGNOSIS — C50912 Malignant neoplasm of unspecified site of left female breast: Secondary | ICD-10-CM | POA: Diagnosis present

## 2017-01-09 DIAGNOSIS — R296 Repeated falls: Secondary | ICD-10-CM | POA: Diagnosis present

## 2017-01-09 DIAGNOSIS — G9340 Encephalopathy, unspecified: Secondary | ICD-10-CM | POA: Diagnosis not present

## 2017-01-09 DIAGNOSIS — G4730 Sleep apnea, unspecified: Secondary | ICD-10-CM | POA: Diagnosis present

## 2017-01-09 DIAGNOSIS — E1122 Type 2 diabetes mellitus with diabetic chronic kidney disease: Secondary | ICD-10-CM | POA: Diagnosis present

## 2017-01-09 DIAGNOSIS — N183 Chronic kidney disease, stage 3 (moderate): Secondary | ICD-10-CM | POA: Diagnosis present

## 2017-01-09 DIAGNOSIS — R627 Adult failure to thrive: Secondary | ICD-10-CM | POA: Diagnosis present

## 2017-01-09 DIAGNOSIS — S21002A Unspecified open wound of left breast, initial encounter: Secondary | ICD-10-CM | POA: Diagnosis present

## 2017-01-09 DIAGNOSIS — R531 Weakness: Secondary | ICD-10-CM | POA: Diagnosis present

## 2017-01-09 DIAGNOSIS — E860 Dehydration: Secondary | ICD-10-CM | POA: Diagnosis present

## 2017-01-09 DIAGNOSIS — Z888 Allergy status to other drugs, medicaments and biological substances status: Secondary | ICD-10-CM

## 2017-01-09 DIAGNOSIS — T502X5A Adverse effect of carbonic-anhydrase inhibitors, benzothiadiazides and other diuretics, initial encounter: Secondary | ICD-10-CM | POA: Diagnosis present

## 2017-01-09 DIAGNOSIS — Z515 Encounter for palliative care: Secondary | ICD-10-CM | POA: Diagnosis present

## 2017-01-09 DIAGNOSIS — F0390 Unspecified dementia without behavioral disturbance: Secondary | ICD-10-CM | POA: Diagnosis present

## 2017-01-09 DIAGNOSIS — G9341 Metabolic encephalopathy: Secondary | ICD-10-CM | POA: Diagnosis present

## 2017-01-09 DIAGNOSIS — Z794 Long term (current) use of insulin: Secondary | ICD-10-CM | POA: Diagnosis not present

## 2017-01-09 DIAGNOSIS — I129 Hypertensive chronic kidney disease with stage 1 through stage 4 chronic kidney disease, or unspecified chronic kidney disease: Secondary | ICD-10-CM | POA: Diagnosis present

## 2017-01-09 DIAGNOSIS — Z7189 Other specified counseling: Secondary | ICD-10-CM | POA: Diagnosis not present

## 2017-01-09 DIAGNOSIS — Z880 Allergy status to penicillin: Secondary | ICD-10-CM

## 2017-01-09 DIAGNOSIS — E785 Hyperlipidemia, unspecified: Secondary | ICD-10-CM | POA: Diagnosis present

## 2017-01-09 DIAGNOSIS — Z79899 Other long term (current) drug therapy: Secondary | ICD-10-CM | POA: Diagnosis not present

## 2017-01-09 LAB — URINALYSIS, COMPLETE (UACMP) WITH MICROSCOPIC
BILIRUBIN URINE: NEGATIVE
Bacteria, UA: NONE SEEN
GLUCOSE, UA: NEGATIVE mg/dL
HGB URINE DIPSTICK: NEGATIVE
Ketones, ur: NEGATIVE mg/dL
LEUKOCYTES UA: NEGATIVE
Nitrite: NEGATIVE
PH: 5 (ref 5.0–8.0)
Protein, ur: NEGATIVE mg/dL
RBC / HPF: NONE SEEN RBC/hpf (ref 0–5)
Specific Gravity, Urine: 1.011 (ref 1.005–1.030)

## 2017-01-09 LAB — CBC
HCT: 31.3 % — ABNORMAL LOW (ref 35.0–47.0)
HEMOGLOBIN: 10.4 g/dL — AB (ref 12.0–16.0)
MCH: 30.2 pg (ref 26.0–34.0)
MCHC: 33.1 g/dL (ref 32.0–36.0)
MCV: 91.3 fL (ref 80.0–100.0)
PLATELETS: 346 10*3/uL (ref 150–440)
RBC: 3.43 MIL/uL — AB (ref 3.80–5.20)
RDW: 16.6 % — ABNORMAL HIGH (ref 11.5–14.5)
WBC: 8.2 10*3/uL (ref 3.6–11.0)

## 2017-01-09 LAB — COMPREHENSIVE METABOLIC PANEL
ALK PHOS: 61 U/L (ref 38–126)
ALT: 7 U/L — ABNORMAL LOW (ref 14–54)
ANION GAP: 9 (ref 5–15)
AST: 14 U/L — ABNORMAL LOW (ref 15–41)
Albumin: 2.6 g/dL — ABNORMAL LOW (ref 3.5–5.0)
BUN: 46 mg/dL — ABNORMAL HIGH (ref 6–20)
CALCIUM: 9.6 mg/dL (ref 8.9–10.3)
CO2: 24 mmol/L (ref 22–32)
Chloride: 107 mmol/L (ref 101–111)
Creatinine, Ser: 2.62 mg/dL — ABNORMAL HIGH (ref 0.44–1.00)
GFR, EST AFRICAN AMERICAN: 18 mL/min — AB (ref >60)
GFR, EST NON AFRICAN AMERICAN: 16 mL/min — AB (ref >60)
Glucose, Bld: 155 mg/dL — ABNORMAL HIGH (ref 65–99)
Potassium: 4.2 mmol/L (ref 3.5–5.1)
Sodium: 140 mmol/L (ref 135–145)
Total Bilirubin: 0.6 mg/dL (ref 0.3–1.2)
Total Protein: 6.5 g/dL (ref 6.5–8.1)

## 2017-01-09 LAB — GLUCOSE, CAPILLARY
Glucose-Capillary: 108 mg/dL — ABNORMAL HIGH (ref 65–99)
Glucose-Capillary: 221 mg/dL — ABNORMAL HIGH (ref 65–99)

## 2017-01-09 MED ORDER — MEMANTINE HCL 5 MG PO TABS
5.0000 mg | ORAL_TABLET | Freq: Two times a day (BID) | ORAL | Status: DC
  Administered 2017-01-09 – 2017-01-12 (×6): 5 mg via ORAL
  Filled 2017-01-09 (×6): qty 1

## 2017-01-09 MED ORDER — TERAZOSIN HCL 5 MG PO CAPS
10.0000 mg | ORAL_CAPSULE | Freq: Every day | ORAL | Status: DC
  Administered 2017-01-09 – 2017-01-11 (×3): 10 mg via ORAL
  Filled 2017-01-09 (×3): qty 2

## 2017-01-09 MED ORDER — SODIUM CHLORIDE 0.9 % IV BOLUS (SEPSIS): 500.0000 mL | Freq: Once | INTRAVENOUS | Status: DC

## 2017-01-09 MED ORDER — INSULIN ASPART 100 UNIT/ML ~~LOC~~ SOLN
0.0000 [IU] | Freq: Three times a day (TID) | SUBCUTANEOUS | Status: DC
  Administered 2017-01-10: 12:00:00 2 [IU] via SUBCUTANEOUS
  Administered 2017-01-11: 1 [IU] via SUBCUTANEOUS
  Administered 2017-01-12: 09:00:00 2 [IU] via SUBCUTANEOUS
  Filled 2017-01-09 (×3): qty 1

## 2017-01-09 MED ORDER — CARBAMIDE PEROXIDE 6.5 % OT SOLN
5.0000 [drp] | Freq: Two times a day (BID) | OTIC | Status: DC
  Administered 2017-01-09 – 2017-01-12 (×7): 5 [drp] via OTIC
  Filled 2017-01-09: qty 15

## 2017-01-09 MED ORDER — DONEPEZIL HCL 23 MG PO TABS
23.0000 mg | ORAL_TABLET | Freq: Every day | ORAL | Status: DC
  Administered 2017-01-09 – 2017-01-11 (×3): 23 mg via ORAL
  Filled 2017-01-09 (×3): qty 1

## 2017-01-09 MED ORDER — ONDANSETRON HCL 4 MG/2ML IJ SOLN: 4.0000 mg | Freq: Four times a day (QID) | INTRAMUSCULAR | Status: DC | PRN

## 2017-01-09 MED ORDER — VITAMIN B-12 1000 MCG PO TABS
500.0000 ug | ORAL_TABLET | Freq: Every day | ORAL | Status: DC
  Administered 2017-01-10 – 2017-01-12 (×3): 500 ug via ORAL
  Filled 2017-01-09 (×3): qty 1

## 2017-01-09 MED ORDER — ACETAMINOPHEN 325 MG PO TABS
650.0000 mg | ORAL_TABLET | Freq: Four times a day (QID) | ORAL | Status: DC | PRN
  Administered 2017-01-10: 06:00:00 650 mg via ORAL
  Filled 2017-01-09: qty 2

## 2017-01-09 MED ORDER — CHOLECALCIFEROL 10 MCG (400 UNIT) PO TABS
400.0000 [IU] | ORAL_TABLET | Freq: Every day | ORAL | Status: DC
  Administered 2017-01-10 – 2017-01-12 (×3): 400 [IU] via ORAL
  Filled 2017-01-09 (×4): qty 1

## 2017-01-09 MED ORDER — POLYETHYLENE GLYCOL 3350 17 G PO PACK: 17.0000 g | PACK | Freq: Every day | ORAL | Status: DC | PRN

## 2017-01-09 MED ORDER — ACETAMINOPHEN 650 MG RE SUPP: 650.0000 mg | Freq: Four times a day (QID) | RECTAL | Status: DC | PRN

## 2017-01-09 MED ORDER — ATORVASTATIN CALCIUM 20 MG PO TABS
40.0000 mg | ORAL_TABLET | Freq: Every day | ORAL | Status: DC
  Administered 2017-01-09 – 2017-01-11 (×3): 40 mg via ORAL
  Filled 2017-01-09 (×4): qty 2

## 2017-01-09 MED ORDER — SODIUM CHLORIDE 0.9 % IV SOLN
Freq: Once | INTRAVENOUS | Status: AC
  Administered 2017-01-09: 14:00:00 via INTRAVENOUS

## 2017-01-09 MED ORDER — SENNA 8.6 MG PO TABS
1.0000 | ORAL_TABLET | Freq: Two times a day (BID) | ORAL | Status: DC
  Administered 2017-01-09 – 2017-01-12 (×6): 8.6 mg via ORAL
  Filled 2017-01-09 (×6): qty 1

## 2017-01-09 MED ORDER — INSULIN GLARGINE 100 UNIT/ML ~~LOC~~ SOLN
23.0000 [IU] | Freq: Every day | SUBCUTANEOUS | Status: DC
  Administered 2017-01-09: 22:00:00 23 [IU] via SUBCUTANEOUS
  Filled 2017-01-09 (×2): qty 0.23

## 2017-01-09 MED ORDER — SODIUM CHLORIDE 0.9 % IV BOLUS (SEPSIS)
1000.0000 mL | Freq: Once | INTRAVENOUS | Status: AC
  Administered 2017-01-09: 1000 mL via INTRAVENOUS

## 2017-01-09 MED ORDER — HEPARIN SODIUM (PORCINE) 5000 UNIT/ML IJ SOLN
5000.0000 [IU] | Freq: Three times a day (TID) | INTRAMUSCULAR | Status: DC
  Administered 2017-01-09 – 2017-01-11 (×4): 5000 [IU] via SUBCUTANEOUS
  Filled 2017-01-09 (×5): qty 1

## 2017-01-09 MED ORDER — BUSPIRONE HCL 15 MG PO TABS
15.0000 mg | ORAL_TABLET | Freq: Two times a day (BID) | ORAL | Status: DC
  Administered 2017-01-09 – 2017-01-12 (×6): 15 mg via ORAL
  Filled 2017-01-09 (×8): qty 1

## 2017-01-09 MED ORDER — PANTOPRAZOLE SODIUM 40 MG PO TBEC
40.0000 mg | DELAYED_RELEASE_TABLET | Freq: Every day | ORAL | Status: DC
  Administered 2017-01-10 – 2017-01-12 (×3): 40 mg via ORAL
  Filled 2017-01-09 (×3): qty 1

## 2017-01-09 MED ORDER — NEBIVOLOL HCL 5 MG PO TABS
10.0000 mg | ORAL_TABLET | Freq: Every day | ORAL | Status: DC
  Administered 2017-01-11 – 2017-01-12 (×2): 10 mg via ORAL
  Filled 2017-01-09 (×3): qty 2
  Filled 2017-01-09: qty 1

## 2017-01-09 MED ORDER — ONDANSETRON HCL 4 MG PO TABS: 4.0000 mg | ORAL_TABLET | Freq: Four times a day (QID) | ORAL | Status: DC | PRN

## 2017-01-09 MED ORDER — INSULIN GLARGINE 100 UNIT/ML SOLOSTAR PEN: 23.0000 [IU] | PEN_INJECTOR | Freq: Every day | SUBCUTANEOUS | Status: DC

## 2017-01-09 NOTE — ED Provider Notes (Signed)
[facility] Emergency Department Provider Note  ____________________________________________  Time seen: Approximately 4:02 PM  I have reviewed the triage vital signs and the nursing notes.   HISTORY  Chief Complaint Altered Mental Status  Level 5 caveat:  Portions of the history and physical were unable to be obtained due to AMS   HPI Jo Bird is a 83 y.o. female with h/o angiosarcoma of the left breast, dementia, DM, CKD, hypercalcemia, HTN, HLD who presents for evaluation of AMS. According to the daughter patient has been more confused and very weak for the last 2 days. Not eating or doing much other than sleep. Has been having auditory and visual hallucinations. Agitated at night time. No fever, cough, abdominal pain, vomiting, or diarrhea. Patient lives at home. Recently finished antibiotics 2 days ago for infection of her chest wall angiosarcoma wound. Has had several recent falls.   Past Medical History:  Diagnosis Date  . Angiosarcoma ([place])    left breast  . Arthritis   . Cancer ([place])    left breast  . Chronic kidney disease (CKD), stage III (moderate) ([place])   . Dementia   . Diabetes mellitus without complication ([place])   . Heart murmur   . Hypercalcemia   . Hyperlipemia   . Hypertension   . Leg edema   . Sleep apnea     Patient Active Problem List   Diagnosis Date Noted  . Encephalopathy acute 01/09/2017    Past Surgical History:  Procedure Laterality Date  . ABDOMINAL HYSTERECTOMY    . CATARACT EXTRACTION    . CHOLECYSTECTOMY    . COLONOSCOPY    . JOINT REPLACEMENT    . TONSILLECTOMY      Prior to Admission medications   Medication Sig Start Date End Date Taking? Authorizing Provider  busPIRone (BUSPAR) 15 MG tablet Take 15 mg by mouth 2 (two) times daily.    Yes [provider]  cholecalciferol (VITAMIN D) 400 units TABS tablet Take 400 Units by mouth.   Yes [provider]  cyanocobalamin 500 MCG  tablet Take 500 mcg by mouth daily.   Yes [provider]  donepezil (ARICEPT) 23 MG TABS tablet Take 1 tablet by mouth at bedtime. 12/19/16  Yes [provider]  esomeprazole (NEXIUM) 40 MG capsule Take 40 mg by mouth daily at 12 noon.   Yes [provider]  furosemide (LASIX) 80 MG tablet Take 80 mg by mouth daily.    Yes [provider]  glipiZIDE (GLUCOTROL) 10 MG tablet Take 10 mg by mouth daily before breakfast.   Yes [provider]  Insulin Glargine (LANTUS SOLOSTAR) 100 UNIT/ML Solostar Pen Inject 23 Units into the skin daily at 10 pm.    Yes [provider]  lisinopril (PRINIVIL,ZESTRIL) 40 MG tablet Take 40 mg by mouth daily.   Yes [provider]  memantine (NAMENDA) 5 MG tablet Take 5 mg by mouth 2 (two) times daily.   Yes [provider]  nebivolol (BYSTOLIC) 10 MG tablet Take 10 mg by mouth daily.   Yes [provider]  olmesartan (BENICAR) 40 MG tablet Take 40 mg by mouth daily.   Yes [provider]  potassium chloride (K-DUR,KLOR-CON) 10 MEQ tablet Take 10 mEq by mouth daily.    Yes [provider]  simvastatin (ZOCOR) 80 MG tablet Take 80 mg by mouth daily.   Yes [provider]  terazosin (HYTRIN) 10 MG capsule Take 10 mg by  mouth at bedtime.   Yes [provider]  ciprofloxacin (CIPRO) 250 MG tablet Take 1 tablet by mouth 2 (two) times daily. 12/27/16   [provider]  doxycycline (VIBRA-TABS) 100 MG tablet Take 1 tablet (100 mg total) by mouth 2 (two) times daily. 07/21/15   Norval Gable, MD  fexofenadine (ALLEGRA) 180 MG tablet Take 180 mg by mouth daily.    [provider]  Glucose Blood (BLOOD GLUCOSE TEST STRIPS) STRP by In Vitro route.    [provider]    Allergies Penicillins and Pioglitazone  Family History  Problem Relation Age of Onset  . Heart failure Mother   . Diabetes Sister     Social History Social History    Tobacco Use  . Smoking status: Never Smoker  . Smokeless tobacco: Never Used  Substance Use Topics  . Alcohol use: No  . Drug use: No    Review of Systems  Constitutional: Negative for fever. Cardiovascular: Negative for chest pain. Respiratory: Negative for shortness of breath. Gastrointestinal: Negative for abdominal pain, vomiting or diarrhea.   Level 5 caveat:  Portions of the history and physical were unable to be obtained due to AMS  ____________________________________________   PHYSICAL EXAM:  VITAL SIGNS: ED Triage Vitals  Enc Vitals Group     BP 01/09/17 1028 (!) 125/58     Pulse Rate 01/09/17 1028 67     Resp 01/09/17 1028 12     Temp 01/09/17 1028 97.7 F (36.5 C)     Temp Source 01/09/17 1028 Oral     SpO2 01/09/17 1028 97 %     Weight 01/09/17 1029 202 lb (91.6 kg)     Height 01/09/17 1029 5\' 6"  (1.676 m)     Head Circumference --      Peak Flow --      Pain Score --      Pain Loc --      Pain Edu? --      Excl. in [place]? --     Constitutional: Alert and oriented x 1, no distress. Cachetic. HEENT:      Head: Normocephalic and atraumatic.         Eyes: Conjunctivae are normal. Sclera is non-icteric.       Mouth/Throat: Mucous membranes are dry.       Neck: Supple with no signs of meningismus. Cardiovascular: Regular rate and rhythm. No murmurs, gallops, or rubs. 2+ symmetrical distal pulses are present in all extremities. No JVD. Respiratory: Normal respiratory effort. Lungs are clear to auscultation bilaterally. No wheezes, crackles, or rhonchi.  Gastrointestinal: Soft, non tender, and non distended with positive bowel sounds. No rebound or guarding. Musculoskeletal: Nontender with normal range of motion in all extremities. No edema, cyanosis, or erythema of extremities. Neurologic: Normal speech and language. Face is symmetric. Moving all extremities. No gross focal neurologic deficits are appreciated. Skin: There is a large wound on the left  chest wall that is packed with cotton balls, foul-smelling but no discharge or erythema    ____________________________________________   LABS (all labs ordered are listed, but only abnormal results are displayed)  Labs Reviewed  COMPREHENSIVE METABOLIC PANEL - Abnormal; Notable for the following components:      Result Value   Glucose, Bld 155 (*)    BUN 46 (*)    Creatinine, Ser 2.62 (*)    Albumin 2.6 (*)    AST 14 (*)    ALT 7 (*)    GFR calc  non Af Amer 16 (*)    GFR calc Af Amer 18 (*)    All other components within normal limits  CBC - Abnormal; Notable for the following components:   RBC 3.43 (*)    Hemoglobin 10.4 (*)    HCT 31.3 (*)    RDW 16.6 (*)    All other components within normal limits  URINALYSIS, COMPLETE (UACMP) WITH MICROSCOPIC - Abnormal; Notable for the following components:   Color, Urine YELLOW (*)    APPearance CLEAR (*)    Squamous Epithelial / LPF 0-5 (*)    All other components within normal limits  CBG MONITORING, ED   ____________________________________________  EKG  ED ECG REPORT I, Rudene Re, the attending physician, personally viewed and interpreted this ECG.  Normal sinus rhythm, rate of 68, normal intervals, normal axis, no ST elevations or depressions, diffuse flattening T waves. No significant changes when compared to prior. ____________________________________________  RADIOLOGY  Head DQ:QIWLNL CT for age.  CXR:  There is no acute cardiopulmonary abnormality.  Thoracic aortic atherosclerosis. ____________________________________________   PROCEDURES  Procedure(s) performed: None Procedures Critical Care performed:  None ____________________________________________   INITIAL IMPRESSION / ASSESSMENT AND PLAN / ED COURSE  83 y.o. female with h/o angiosarcoma of the left breast, dementia, DM, CKD, hypercalcemia, HTN, HLD who presents for evaluation of AMS, generalized weakness, several falls, decreased  appetite. Patient found to have acute kidney injury for which she was started on IV fluids. No evidence of UTI or pneumonia. Head CT was negative. Patient was admitted to the hospitalist service for IV hydration.      As part of my medical decision making, I reviewed the following data within the [place] notes reviewed and incorporated, Labs reviewed , EKG interpreted , Old EKG reviewed, Old chart reviewed, Radiograph reviewed , Discussed with admitting physician , Notes from prior ED visits and [place] Controlled Substance Database    Pertinent labs & imaging results that were available during my care of the patient were reviewed by me and considered in my medical decision making (see chart for details).    ____________________________________________   FINAL CLINICAL IMPRESSION(S) / ED DIAGNOSES  Final diagnoses:  AKI (acute kidney injury) ([place])  Encephalopathy acute      NEW MEDICATIONS STARTED DURING THIS VISIT:  ED Discharge Orders    None       Note:  This document was prepared using Dragon voice recognition software and may include unintentional dictation errors.    Rudene Re, MD 01/09/17 [phone]

## 2017-01-09 NOTE — ED Triage Notes (Signed)
Pt in via [facility] from home, per EMS, pt with increasing confusion today, last known well time unknown.  Per EMS pt recently taking antibiotics but cause is also unknown at this time.  Pt A/Ox3, states, "I dont know whats going on."  Vitals WDL, NAD noted at this time.

## 2017-01-09 NOTE — Progress Notes (Signed)
Left chest wound from radiation treatment for breast cancer.  Current home dressing regimen per patient's daughter: place cotton balls in wound, cover with diaper attached w/ paper tape daily. Assessed, cleansed, and measured with Clarise Cruz RN. Temporary dressing placed until wound consult tomorrow.

## 2017-01-09 NOTE — H&P (Signed)
Jo Bird at [place] NAME: Jo Bird    MR#:  [id number]  DATE OF BIRTH:  [birth year]  DATE OF ADMISSION:  01/09/2017  PRIMARY CARE PHYSICIAN: Adin Hector, MD   REQUESTING/REFERRING PHYSICIAN: Dr.veronese  CHIEF COMPLAINT:  Increasing confusion, poor appetite, frequent falls, declining medical condition for the past several days Has history of dementia history is obtained from patient's daughter in the ER.  Patient unable to give any history review of system  HISTORY OF PRESENT ILLNESS:  Jo Bird  is a 83 y.o. 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 Reports patient has overall declined in her well-being with poor appetite, falls, increasing confusion, difficult ambulation at home. She recently finished a 10-day course from 1129-12 9 with doxycycline and Cipro for her left breast chronic open wound. She does not have any fever present white count is normal.  Wound is foul-smelling. Pt appears cachectic malnourished  She was found to have creatinine of 2.62.  She also is taking diuretics at home and several other medications that could have affected her kidney numbers including poor p.o. intake.  She is being admitted for further evaluation and management  PAST MEDICAL HISTORY:   Past Medical History:  Diagnosis Date  . Angiosarcoma ([place])    left breast  . Arthritis   . Cancer ([place])    left breast  . Chronic kidney disease (CKD), stage III (moderate) ([place])   . Dementia   . Diabetes mellitus without complication ([place])   . Heart murmur   . Hypercalcemia   . Hyperlipemia   . Hypertension   . Leg edema   . Sleep  apnea     PAST SURGICAL HISTOIRY:   Past Surgical History:  Procedure Laterality Date  . ABDOMINAL HYSTERECTOMY    . CATARACT EXTRACTION    . CHOLECYSTECTOMY    . COLONOSCOPY    . JOINT REPLACEMENT    . TONSILLECTOMY      SOCIAL HISTORY:   Social History   Tobacco Use  . Smoking status: Never Smoker  . Smokeless tobacco: Never Used  Substance Use Topics  . Alcohol use: No    FAMILY HISTORY:   Family History  Problem Relation Age of Onset  . Heart failure Mother   . Diabetes Sister     DRUG ALLERGIES:   Allergies  Allergen Reactions  . Penicillins Other (See Comments)    Has patient had a PCN reaction causing immediate rash, facial/tongue/throat swelling, SOB or lightheadedness with hypotension: No Has patient had a PCN reaction causing severe rash involving mucus membranes or skin necrosis: No Has patient had a PCN reaction that required hospitalization: No Has patient had a PCN reaction occurring within the last 10 years: No If all of the above answers are "NO", then may proceed with Cephalosporin use.  . Pioglitazone Other (See Comments)    edema    REVIEW OF SYSTEMS:  Review of Systems  Unable to perform ROS: Dementia     MEDICATIONS AT HOME:   Prior to Admission medications   Medication Sig Start Date End Date Taking? Authorizing Provider  busPIRone (BUSPAR) 15 MG tablet Take 15 mg by mouth 2 (two) times daily.    Yes  [provider]  cholecalciferol (VITAMIN D) 400 units TABS tablet Take 400 Units by mouth.   Yes [provider]  cyanocobalamin 500 MCG tablet Take 500 mcg by mouth daily.   Yes [provider]  donepezil (ARICEPT) 23 MG TABS tablet Take 1 tablet by mouth at bedtime. 12/19/16  Yes [provider]  esomeprazole (NEXIUM) 40 MG capsule Take 40 mg by mouth daily at 12 noon.   Yes [provider]  furosemide (LASIX) 80 MG tablet Take 80 mg by mouth daily.    Yes [provider]   glipiZIDE (GLUCOTROL) 10 MG tablet Take 10 mg by mouth daily before breakfast.   Yes [provider]  Insulin Glargine (LANTUS SOLOSTAR) 100 UNIT/ML Solostar Pen Inject 23 Units into the skin daily at 10 pm.    Yes [provider]  lisinopril (PRINIVIL,ZESTRIL) 40 MG tablet Take 40 mg by mouth daily.   Yes [provider]  memantine (NAMENDA) 5 MG tablet Take 5 mg by mouth 2 (two) times daily.   Yes [provider]  nebivolol (BYSTOLIC) 10 MG tablet Take 10 mg by mouth daily.   Yes [provider]  olmesartan (BENICAR) 40 MG tablet Take 40 mg by mouth daily.   Yes [provider]  potassium chloride (K-DUR,KLOR-CON) 10 MEQ tablet Take 10 mEq by mouth daily.    Yes [provider]  simvastatin (ZOCOR) 80 MG tablet Take 80 mg by mouth daily.   Yes [provider]  terazosin (HYTRIN) 10 MG capsule Take 10 mg by mouth at bedtime.   Yes [provider]  ciprofloxacin (CIPRO) 250 MG tablet Take 1 tablet by mouth 2 (two) times daily. 12/27/16   [provider]  doxycycline (VIBRA-TABS) 100 MG tablet Take 1 tablet (100 mg total) by mouth 2 (two) times daily. 07/21/15   Norval Gable, MD  fexofenadine (ALLEGRA) 180 MG tablet Take 180 mg by mouth daily.    [provider]  Glucose Blood (BLOOD GLUCOSE TEST STRIPS) STRP by In Vitro route.    [provider]      VITAL SIGNS:  Blood pressure (!) 156/63, pulse (!) 58, temperature 97.7 F (36.5 C), temperature source Oral, resp. rate 11, height 5\' 6"  (1.676 m), weight 91.6 kg (202 lb), SpO2 97 %.  PHYSICAL EXAMINATION:  GENERAL:  83 y.o.-year-old patient lying in the bed with no acute distress.  Peers chronically ill EYES: Pupils equal, round, reactive to light and accommodation. No scleral icterus. Extraocular muscles intact.  HEENT: Head atraumatic, normocephalic. Oropharynx and nasopharynx clear.  NECK:  Supple, no jugular venous distention. No  thyroid enlargement, no tenderness.  LUNGS: Normal breath sounds bilaterally, no wheezing, rales,rhonchi or crepitation. No use of accessory muscles of respiration.  CARDIOVASCULAR: S1, S2 normal. No murmurs, rubs, or gallops.  ABDOMEN: Soft, nontender, nondistended. Bowel sounds present. No organomegaly or mass.  EXTREMITIES: Dry shrunken skin no edema NEUROLOGIC: To examine since patient has dementia.  Overall moves all extremities well PSYCHIATRIC: The patient is alert and awake SKIN: Recent has a chronic left upper chest wound with significant amount of smell and slough tissue.  No surrounding cellulitis.  Wound is packed with cotton balls.  LABORATORY PANEL:   CBC Recent Labs  Lab 01/09/17 1028  WBC 8.2  HGB 10.4*  HCT 31.3*  PLT 346   ------------------------------------------------------------------------------------------------------------------  Chemistries  Recent Labs  Lab 01/09/17 1028  NA 140  K 4.2  CL 107  CO2 24  GLUCOSE 155*  BUN 46*  CREATININE 2.62*  CALCIUM 9.6  AST 14*  ALT 7*  ALKPHOS 61  BILITOT 0.6   ------------------------------------------------------------------------------------------------------------------  Cardiac Enzymes No results for input(s): TROPONINI in the last 168 hours. ------------------------------------------------------------------------------------------------------------------  RADIOLOGY:  Dg Chest 2 View  Result Date: 01/09/2017 CLINICAL DATA:  Weakness today. Fell from bed yesterday. History of left breast malignancy, chronic renal insufficiency stage III. EXAM: CHEST  2 VIEW COMPARISON:  CT scan chest of March 13, 2014 FINDINGS: The lungs are well-expanded. There is no focal infiltrate. There is no pleural effusion. The perihilar lung markings are increased but stable. The heart is top-normal in size. The pulmonary vascularity is not engorged. There is no pleural effusion. There is calcification in the wall of the  aortic arch. There is mild multilevel degenerative disc disease of the thoracic spine. IMPRESSION: There is no acute cardiopulmonary abnormality. Thoracic aortic atherosclerosis. Electronically Signed   By: David  Martinique M.D.   On: 01/09/2017 12:00   Ct Head Wo Contrast  Result Date: 01/09/2017 CLINICAL DATA:  Increasing confusion EXAM: CT HEAD WITHOUT CONTRAST TECHNIQUE: Contiguous axial images were obtained from the base of the skull through the vertex without intravenous contrast. COMPARISON:  03/13/2014 FINDINGS: Brain: No evidence of acute infarction, hemorrhage, hydrocephalus, extra-axial collection or mass lesion/mass effect. Mild age-related atrophy is again seen. Vascular: No hyperdense vessel or unexpected calcification. Skull: Normal. Negative for fracture or focal lesion. Sinuses/Orbits: No acute finding. Other: None. IMPRESSION: Normal CT for age. Electronically Signed   By: Inez Catalina M.D.   On: 01/09/2017 12:03    EKG:    IMPRESSION AND PLAN:   Jo Bird  is a 83 y.o. 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 Reports patient has overall declined in her well-being with poor appetite, falls, increasing confusion, difficult ambulation at home.  1.  Acute metabolic encephalopathy multifactorial -CT head negative. -Supportive care. -Patient is alert and awake at present  2.  Acute on chronic renal failure secondary to poor p.o. intake, failure to thrive, dehydration, patient being on diuretics and other antihypertensive meds -IV fluids.  Monitor I's and O's. -Patient baseline creatinine around 1.9  -Creatinine November 29 was 2.4 as outpatient -Avoid nephrotoxins.  I will  hold off on ACE inhibitors -Consider nephrology consultation if needed  3.  Failure to thrive with progressive decline in overall well-being -Patient's daughter concerned about patient not eating well, falls, increasing confusion likely secondary to dementia worsening -Consider palliative care consultation  4.  Left breast angiosarcoma with radiation causing significant chronic left chest wound -Not appear infected at present.  Significant amount of slough.  We will do wound care consult -Consider surgical consultation to see if slough can be debrided -Just recently finished a course of doxycycline and Cipro on 01/07/2017  5.  DVT prophylaxis subcu heparin  6.  Generalized weakness physical therapy  All the records are reviewed and case discussed with ED provider. Management plans discussed with the patient, family and they are in agreement.  CODE STATUS: Full discussed with daughter  TOTAL TIME TAKING CARE OF THIS PATIENT: *50* minutes.    Fritzi Mandes M.D on 01/09/2017 at 1:30 PM  Between 7am to 6pm - Pager - [phone]  After 6pm go  to [email or web address] - password [email or web address] [facility]  [facility]  Office  [phone]  CC: Primary care physician; Adin Hector, MD

## 2017-01-09 NOTE — Progress Notes (Signed)
Patient's daughter stated doctor in ER stated we could clean and flush patients ears out. Dr. Posey Pronto paged.

## 2017-01-09 NOTE — Progress Notes (Signed)
CH responded to an OR for an AD. [place] educated the Pt and family on the document. CH suggested that the family and Pt read through the materials together as the Pt seemed a bit groggy. [place] suggests a re-evaluation be made in the morning when the Pt may be more awake. CH offered prayer.    01/09/17 1505  Clinical Encounter Type  Visited With Patient;Patient and family together  Visit Type Initial;Spiritual support  Referral From Nurse  Consult/Referral To Chaplain  Spiritual Encounters  Spiritual Needs Literature;Prayer  Advance Directives (For Healthcare)  Does Patient Have a Medical Advance Directive? No  Would patient like information on creating a medical advance directive? Yes (Inpatient - patient requests chaplain consult to create a medical advance directive)  [place] Directives  Does Patient Have a Mental Health Advance Directive? No  Would patient like information on creating a mental health advance directive? No - Patient declined

## 2017-01-09 NOTE — ED Notes (Signed)
Patient transported to X-ray 

## 2017-01-09 NOTE — ED Notes (Signed)
Family to bedside at this time.

## 2017-01-10 LAB — GLUCOSE, CAPILLARY
GLUCOSE-CAPILLARY: 146 mg/dL — AB (ref 65–99)
Glucose-Capillary: 61 mg/dL — ABNORMAL LOW (ref 65–99)
Glucose-Capillary: 84 mg/dL (ref 65–99)
Glucose-Capillary: 162 mg/dL — ABNORMAL HIGH (ref 65–99)
Glucose-Capillary: 95 mg/dL (ref 65–99)

## 2017-01-10 LAB — BASIC METABOLIC PANEL
Anion gap: 5 (ref 5–15)
BUN: 40 mg/dL — AB (ref 6–20)
CHLORIDE: 112 mmol/L — AB (ref 101–111)
CO2: 23 mmol/L (ref 22–32)
CREATININE: 2.22 mg/dL — AB (ref 0.44–1.00)
Calcium: 9.3 mg/dL (ref 8.9–10.3)
GFR calc Af Amer: 22 mL/min — ABNORMAL LOW (ref >60)
GFR calc non Af Amer: 19 mL/min — ABNORMAL LOW (ref >60)
Glucose, Bld: 67 mg/dL (ref 65–99)
Potassium: 3.8 mmol/L (ref 3.5–5.1)
Sodium: 140 mmol/L (ref 135–145)

## 2017-01-10 MED ORDER — ADULT MULTIVITAMIN W/MINERALS CH
1.0000 | ORAL_TABLET | Freq: Every day | ORAL | Status: DC
  Administered 2017-01-11 – 2017-01-12 (×2): 1 via ORAL
  Filled 2017-01-10 (×2): qty 1

## 2017-01-10 MED ORDER — PREMIER PROTEIN SHAKE
11.0000 [oz_av] | Freq: Two times a day (BID) | ORAL | Status: DC
  Administered 2017-01-11 – 2017-01-12 (×3): 11 [oz_av] via ORAL

## 2017-01-10 MED ORDER — DAKINS (1/2 STRENGTH) 0.25 % EX SOLN
1.0000 "application " | Freq: Every day | CUTANEOUS | Status: DC
  Administered 2017-01-11 (×2): 1
  Filled 2017-01-10: qty 473

## 2017-01-10 MED ORDER — SODIUM CHLORIDE 0.9 % IV SOLN
INTRAVENOUS | Status: DC
  Administered 2017-01-10: 18:00:00 via INTRAVENOUS

## 2017-01-10 MED ORDER — INSULIN GLARGINE 100 UNIT/ML ~~LOC~~ SOLN
10.0000 [IU] | Freq: Every day | SUBCUTANEOUS | Status: DC
  Administered 2017-01-10 – 2017-01-11 (×2): 10 [IU] via SUBCUTANEOUS
  Filled 2017-01-10 (×3): qty 0.1

## 2017-01-10 NOTE — Progress Notes (Signed)
[place] [facility] [place] NAME: Jo Bird    MR#:  [id number]  DATE OF BIRTH:  1929/11/24  SUBJECTIVE:  CHIEF COMPLAINT:   Chief Complaint  Patient presents with  . 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 Also have decreased oral intake and weight loss, terminal dementia and worsening confusion last few days. Have some dehydration. Daughter in room.  REVIEW OF SYSTEMS:   Due to dementia pt can not give ROS.  ROS  DRUG ALLERGIES:   Allergies  Allergen Reactions  . Penicillins Other (See Comments)    Has patient had a PCN reaction causing immediate rash, facial/tongue/throat swelling, SOB or lightheadedness with hypotension: No Has patient had a PCN reaction causing severe rash involving mucus membranes or skin necrosis: No Has patient had a PCN reaction that required hospitalization: No Has patient had a PCN reaction occurring within the last 10 years: No If all of the above answers are "NO", then may proceed with Cephalosporin use.  . Pioglitazone Other (See Comments)    edema    VITALS:  Blood pressure (!) 126/48, pulse 74, temperature 98.6 F (37 C), temperature source Oral, resp. rate 18, height 5\' 6"  (1.676 m), weight 91.6 kg (202 lb), SpO2 98 %.  PHYSICAL EXAMINATION:   GENERAL:  83 y.o.-year-old patient lying in the bed with no acute distress.  Peers chronically ill EYES: Pupils equal, round, reactive to light and accommodation. No scleral icterus. Extraocular muscles intact.  HEENT: Head atraumatic, normocephalic. Oropharynx and nasopharynx clear.  NECK:  Supple, no jugular venous distention. No thyroid enlargement, no tenderness.  LUNGS: Normal breath sounds bilaterally, no wheezing, rales,rhonchi or crepitation. No use of accessory muscles of respiration.  CARDIOVASCULAR: S1, S2 normal. No murmurs, rubs, or gallops.  ABDOMEN: Soft, nontender,  nondistended. Bowel sounds present. No organomegaly or mass.  EXTREMITIES: Dry shrunken skin no edema NEUROLOGIC: To examine since patient has dementia.  Overall moves all extremities well PSYCHIATRIC: The patient is alert and awake SKIN: Recent has a chronic left upper chest wound with significant amount of smell and slough tissue.  No surrounding cellulitis.  Wound is under dressing.   Physical Exam LABORATORY PANEL:   CBC Recent Labs  Lab 01/09/17 1028  WBC 8.2  HGB 10.4*  HCT 31.3*  PLT 346   ------------------------------------------------------------------------------------------------------------------  Chemistries  Recent Labs  Lab 01/09/17 1028 01/10/17 0611  NA 140 140  K 4.2 3.8  CL 107 112*  CO2 24 23  GLUCOSE 155* 67  BUN 46* 40*  CREATININE 2.62* 2.22*  CALCIUM 9.6 9.3  AST 14*  --   ALT 7*  --   ALKPHOS 61  --   BILITOT 0.6  --    ------------------------------------------------------------------------------------------------------------------  Cardiac Enzymes No results for input(s): TROPONINI in the last 168 hours. ------------------------------------------------------------------------------------------------------------------  RADIOLOGY:  Dg Chest 2 View  Result Date: 01/09/2017 CLINICAL DATA:  Weakness today. Fell from bed yesterday. History of left breast malignancy, chronic renal insufficiency stage III. EXAM: CHEST  2 VIEW COMPARISON:  CT scan chest of March 13, 2014 FINDINGS: The lungs are well-expanded. There is no focal infiltrate. There is no pleural effusion. The perihilar lung markings are increased but stable. The heart is top-normal in size. The pulmonary vascularity is not engorged. There is no pleural effusion. There is calcification in the wall of the aortic arch. There is mild multilevel degenerative  disc disease of the thoracic spine. IMPRESSION: There is no acute cardiopulmonary abnormality. Thoracic aortic atherosclerosis.  Electronically Signed   By: David  Martinique M.D.   On: 01/09/2017 12:00   Ct Head Wo Contrast  Result Date: 01/09/2017 CLINICAL DATA:  Increasing confusion EXAM: CT HEAD WITHOUT CONTRAST TECHNIQUE: Contiguous axial images were obtained from the base of the skull through the vertex without intravenous contrast. COMPARISON:  03/13/2014 FINDINGS: Brain: No evidence of acute infarction, hemorrhage, hydrocephalus, extra-axial collection or mass lesion/mass effect. Mild age-related atrophy is again seen. Vascular: No hyperdense vessel or unexpected calcification. Skull: Normal. Negative for fracture or focal lesion. Sinuses/Orbits: No acute finding. Other: None. IMPRESSION: Normal CT for age. Electronically Signed   By: Inez Catalina M.D.   On: 01/09/2017 12:03    ASSESSMENT AND PLAN:   Active Problems:   Encephalopathy acute  Jo Bird  is a 83 y.o. 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 Reports patient has overall declined in her well-being with poor appetite, falls, increasing confusion, difficult ambulation at home.  1.  Acute metabolic encephalopathy multifactorial -CT head negative. -Supportive care. -Patient is alert and awake at present  2.  Acute on chronic renal failure , CKD stage 3 secondary to poor p.o. intake, failure to thrive, dehydration, patient being on diuretics and other antihypertensive meds -IV fluids.  Monitor I's and O's. -Patient baseline creatinine around 1.9- 1.7  -Creatinine November 29 was 2.4 as outpatient -Avoid nephrotoxins.  I will hold off on ACE inhibitors - IV hydration.  3.  Failure to thrive with progressive decline in overall  well-being -Patient's daughter concerned about patient not eating well, falls, increasing confusion likely secondary to dementia worsening - palliative care consultation  4.  Left breast angiosarcoma with radiation causing significant chronic left chest wound -Not appear infected at present.  Significant amount of slough.    wound care consult -Just recently finished a course of doxycycline and Cipro on 01/07/2017  5.  DVT prophylaxis subcu heparin  6.  Generalized weakness physical therapy   All the records are reviewed and case discussed with Care Management/Social Workerr. Management plans discussed with the patient, family and they are in agreement.  CODE STATUS: Full.  TOTAL TIME TAKING CARE OF THIS PATIENT: 35 minutes.   POSSIBLE D/C IN 1-2 DAYS, DEPENDING ON CLINICAL CONDITION.   Vaughan Basta M.D on 01/10/2017   Between 7am to 6pm - Pager - [phone]  After 6pm go to [email or web address] - password [email or web address] [place] [facility]  Office  [phone]  CC: Primary care physician; Adin Hector, MD  Note: This dictation was prepared with Dragon dictation along with smaller phrase technology. Any transcriptional errors that result from this process are unintentional.

## 2017-01-10 NOTE — Consult Note (Signed)
[facility] Nurse wound consult note Reason for Consult:Angiosarcoma to left breast.  Chronic.   Wound type: cancerous lesion to left breast and chest wall.  Pressure Injury POA: NA Measurement: 8 cm x 10 cm x 2.4 cm  Wound bed:75% devitalized tissue 25 % pale pink nongranulating Drainage (amount, consistency, odor) Minimal serosanguinous  Strong, necrotic, sour smell.  Periwound:intact Dressing procedure/placement/frequency: Cleanse left breast wound with NS and pat gently dry.  Apply barrier cream to the periwound skin to avoid maceration.  Gently fill wound depth with dakins moist gauze.  Cover with ABD pad and tape.  Change daily.  Will not follow at this time.  Please re-consult if needed.  Domenic Moras RN BSN [place] Pager [phone]

## 2017-01-10 NOTE — Progress Notes (Signed)
Inpatient Diabetes Program Recommendations  AACE/ADA: New Consensus Statement on Inpatient Glycemic Control (2015)  Target Ranges:  Prepandial:   less than 140 mg/dL      Peak postprandial:   less than 180 mg/dL (1-2 hours)      Critically ill patients:  140 - 180 mg/dL    Results for Jo Bird, Jo Bird (MRN [id number]) as of 01/10/2017 09:26  Ref. Range 01/10/2017 07:51  Glucose-Capillary Latest Ref Range: 65 - 99 mg/dL 61 (L)    Admit with: AMS/ Failure to Thrive  History: DM, Dementia, CKD  Home DM Meds: Lantus 23 units QHS       Glipizide 10 mg daily  Current Insulin Orders: Lantus 23 units QHS      Novolog Sensitive Correction Scale/ SSI (0-9 units) TID AC       MD- Note patient with Hypoglycemia this AM (CBG down to 61 mg/dl) after receiving 23 units Lantus last night.  Please consider reducing Lantus to 18 units QHS (80% total home dose)     --Will follow patient during hospitalization--  Wyn Quaker RN, MSN, CDE Diabetes Coordinator Inpatient Glycemic Control Team Team Pager: [phone] (8a-5p)

## 2017-01-10 NOTE — Progress Notes (Signed)
Initial Nutrition Assessment  DOCUMENTATION CODES:   Obesity unspecified  INTERVENTION:  Provide Premier Protein po BID, each supplement provides 160 kcal and 30 grams of protein.  Provide multivitamin with minerals daily.  NUTRITION DIAGNOSIS:   Increased nutrient needs related to wound healing, cancer and cancer related treatments as evidenced by estimated needs.  GOAL:   Patient will meet greater than or equal to 90% of their needs  MONITOR:   PO intake, Supplement acceptance, Labs, Weight trends, I & O's  REASON FOR ASSESSMENT:   Malnutrition Screening Tool    ASSESSMENT:   83 year old female with PMHx of dementia, HTN, arthritis, HLD, CKD stage III, angiosarcoma to left breast on XRT, DM type 2 admitted with acute metabolic encephalopathy, acute on chronic renal failure, failure to thrive.   Met with patient at bedside. She was confused and there were no family members present at time of RD assessment. Patient reports her appetite is good and unchanged from baseline. She reports she eats 2 meals per day, but is unable to report what she typically eats. Per RN she had toast, eggs, and yogurt for breakfast. Per chart she finished 100% of her lunch. Patient is unsure of her weight history.  Daughter has reported patient lost 30 lbs over the past few months. Per chart patient was 226 lbs on 08/31/2016 and 206 lbs on 12/27/2016. Unable to verify current weight as patient was sitting in chair and not on bed and weights in chart do not appear to be true measured weights.  Meal Completion: 100%  Medications reviewed and include: vitamin D 400 units daily, Novolog 0-9 units TID, Lantus 10 units QHS, pantoprazole, vitamin B12 500 micrograms daily.  Labs reviewed: CBG 61-162, Chloride 112, BUN 40, Creatinine 2.22.  Patient does not meet criteria for malnutrition at this time, but is at risk for malnutrition.  Discussed with RN.  NUTRITION - FOCUSED PHYSICAL EXAM:    Most  Recent Value  Orbital Region  No depletion  Upper Arm Region  No depletion  Thoracic and Lumbar Region  No depletion  Buccal Region  No depletion  Temple Region  No depletion  Clavicle Bone Region  No depletion  Clavicle and Acromion Bone Region  No depletion  Scapular Bone Region  No depletion  Dorsal Hand  No depletion  Patellar Region  No depletion  Anterior Thigh Region  No depletion  Posterior Calf Region  No depletion  Edema (RD Assessment)  Mild  Hair  Reviewed  Eyes  Reviewed  Mouth  Reviewed  Skin  Reviewed  Nails  Reviewed     Diet Order:  Diet Carb Modified Fluid consistency: Thin; Room service appropriate? Yes  EDUCATION NEEDS:   No education needs have been identified at this time  Skin:  Skin Assessment: Skin Integrity Issues: Skin Integrity Issues:: Other (Comment) Other: angiosarcoma to left breast (8cm x 10cm x 2.4cm)  Last BM:  PTA (01/08/2017 per chart)  Height:   Ht Readings from Last 1 Encounters:  01/09/17 _0  (1.676 m)    Weight:   Wt Readings from Last 1 Encounters:  01/09/17 202 lb (91.6 kg)    Ideal Body Weight:  59.1 kg  BMI:  Body mass index is 32.6 kg/m.  Estimated Nutritional Needs:   Kcal:  1660-1940 (MSJ x 1.2-1.4)  Protein:  90-110 grams (1-1.2 grams/kg)  Fluid:  1.5 L/day (25 mL/kg IBW)  Willey Blade, MS, RD, LDN Office: [phone] Pager: [phone] After Hours/Weekend Pager: [phone]

## 2017-01-10 NOTE — Clinical Social Work Note (Signed)
CSW received referral for SNF.  Case discussed with case manager and plan is to discharge home with home health.  CSW to sign off please re-consult if social work needs arise.  Raynard Mapps R. Avantae Bither, MSW, LCSWA [phone]  

## 2017-01-10 NOTE — Progress Notes (Signed)
Pt's BG of 61 this am. Pt assessed, pt asymptomatic. 4 oz orange juice given per protocol, BG recheck is 84. Will continue to monitor. Ammie Dalton, RN

## 2017-01-10 NOTE — Care Management Note (Addendum)
Case Management Note  Patient Details  Name: Jo Bird MRN: [id number] Date of Birth: [birth year]  Subjective/Objective:  Admitted to [facility] with the diagnosis of acute kidney injury. lives with daughter, Derry Skill [phone]). Prescriptions are filled at [facility] in [place]. No home Health. [facility] in the past following hip surgery. No home oxygen. No medical equipment in home. Takes care of all basic activities of daily living herself. May need help with bath and dressing. Losses balance when gets up quickly and rolled out of bed too. Daughter states she has moments of out burst. Decreased appetite for a while. Lost weight.                 Action/Plan: Physical therapy evaluation completed. Recommending home with home health and therapy. Supervision in the home.    Expected Discharge Date:                  Expected Discharge Plan:     In-House Referral:   yes  Discharge planning Services     Post Acute Care Choice:    Choice offered to:     DME Arranged:    DME Agency:     HH Arranged:    HH Agency:     Status of Service:     If discussed at [employer], dates discussed:    Additional Comments:  Shelbie Ammons, RN MSN CCM Care Management [phone] 01/10/2017, 10:35 AM

## 2017-01-10 NOTE — Evaluation (Signed)
Physical Therapy Evaluation Patient Details Name: Jo Bird MRN: [id number] DOB: 06/16/1929 Today's Date: 01/10/2017   History of Present Illness  Jo Bird is an 83yo black female who comes to [facility] on 12/11 after worsening confusion and weakness, hypotension at home, and 2 weeks of poor PO intake. PMH: dementia, LtBrCA c current wound managed by daughter, HTN, HLD, DM. Daughter Davy Pique reports that pt was in her usual state of health 3-4 weeks ago, mostly household distance AMB without A/E, no falls or unsteadiness. She has since progressively stopped eating/drinking as much, had more intermittent confusion (especially upon waking in the night), unsteadiness on feet, and weight loss. At baseline pt is independent with ADL, lives with Dtr, Son, and grandson, alone a few hours each day.     Clinical Impression  Pt admitted with above diagnosis. Pt currently with functional limitations due to the deficits listed below (see "PT Problem List"). Upon entry, the patient is received semirecumbent in bed finishing breakfast, daughter Davy Pique present who assists with background information d/t pt baseline memory deficits. The pt is awake and agreeable to participate. No acute distress noted at this time. The pt is oriented to self, pleasant, conversational, and following simple and multi-step commands consistently. Functional mobility assessment demonstrates mild strength impairment trunk and BLE, mild gait instability/unsteadiness on feet, and decreased activity tolerance, the pt now requiring minGuard assist physical assistance for AMB, which is limited to household distances only: the patient performed these at a higher level of independence at baseline. Empirically, the patient demonstrates increased risk of recurrent falls AEB gait speed <0.63m/s. Pt will benefit from skilled PT intervention to increase independence and safety with basic mobility in preparation for discharge to the venue listed below.        Follow Up Recommendations Home health PT;Supervision/Assistance - 24 hour    Equipment Recommendations  Other (comment)(rollator walker )    Recommendations for Other Services       Precautions / Restrictions Precautions Precautions: Fall Restrictions Weight Bearing Restrictions: No      Mobility  Bed Mobility Overal bed mobility: Modified Independent                Transfers Overall transfer level: Needs assistance Equipment used: None Transfers: Sit to/from Stand Sit to Stand: Min guard         General transfer comment: moderate BUE assist to come to standing.   Ambulation/Gait Ambulation/Gait assistance: Min guard Ambulation Distance (Feet): 350 Feet Assistive device: 1 person hand held assist Gait Pattern/deviations: Step-to pattern Gait velocity: 0.31m/s Gait velocity interpretation: <1.8 ft/sec, indicative of risk for recurrent falls General Gait Details: inititally heavily reaching for railings and objection in room for external stabilization strategies, then PT offers single UE assist; Pt very fatigued in legs after 368ft, increased level of concern about potential leg buckling.   Stairs            Wheelchair Mobility    Modified Rankin (Stroke Patients Only)       Balance Overall balance assessment: Needs assistance(generally unsteady, has had weakness and hypotension recently. )                                           Pertinent Vitals/Pain Pain Assessment: No/denies pain    Home Living Family/patient expects to be discharged to:: Private residence Living Arrangements: Children Available Help at Discharge: Family Type  of Home: House Home Access: Stairs to enter Entrance Stairs-Rails: Can reach both Entrance Stairs-Number of Steps: 3 Home Layout: One level Home Equipment: Shower seat      Prior Function Level of Independence: Independent         Comments: household distance AMB only; has not driven in  'years'.       Hand Dominance        Extremity/Trunk Assessment   Upper Extremity Assessment Upper Extremity Assessment: Defer to OT evaluation    Lower Extremity Assessment Lower Extremity Assessment: Generalized weakness(decreased activity tolerance, legs fatiguer after 327ft )       Communication   Communication: No difficulties  Cognition Arousal/Alertness: Awake/alert Behavior During Therapy: WFL for tasks assessed/performed Overall Cognitive Status: Within Functional Limits for tasks assessed                                        General Comments      Exercises     Assessment/Plan    PT Assessment Patient needs continued PT services  PT Problem List Decreased strength;Decreased activity tolerance;Decreased balance;Decreased mobility       PT Treatment Interventions DME instruction;Balance training;Gait training;Stair training;Functional mobility training;Therapeutic activities;Therapeutic exercise;Patient/family education;Cognitive remediation    PT Goals (Current goals can be found in the Care Plan section)  Acute Rehab PT Goals Patient Stated Goal: make walking easier  PT Goal Formulation: With patient/family Time For Goal Achievement: 01/24/17 Potential to Achieve Goals: Good    Frequency Min 2X/week   Barriers to discharge Decreased caregiver support      Co-evaluation               AM-PAC PT "6 Clicks" Daily Activity  Outcome Measure Difficulty turning over in bed (including adjusting bedclothes, sheets and blankets)?: A Little Difficulty moving from lying on back to sitting on the side of the bed? : A Little Difficulty sitting down on and standing up from a chair with arms (e.g., wheelchair, bedside commode, etc,.)?: A Little Help needed moving to and from a bed to chair (including a wheelchair)?: A Little Help needed walking in hospital room?: A Little Help needed climbing 3-5 steps with a railing? : A Lot 6 Click  Score: 17    End of Session Equipment Utilized During Treatment: Gait belt Activity Tolerance: Patient limited by fatigue;No increased pain;Patient tolerated treatment well Patient left: in chair;with call bell/phone within reach;with chair alarm set;with family/visitor present Nurse Communication: Mobility status PT Visit Diagnosis: Unsteadiness on feet (R26.81);Muscle weakness (generalized) (M62.81);Difficulty in walking, not elsewhere classified (R26.2)    Time: 1093-2355 PT Time Calculation (min) (ACUTE ONLY): 38 min   Charges:   PT Evaluation $PT Eval Moderate Complexity: 1 Mod PT Treatments $Therapeutic Activity: 8-22 mins   PT G Codes:   PT G-Codes **NOT FOR INPATIENT CLASS** Functional Assessment Tool Used: AM-PAC 6 Clicks Basic Mobility;Clinical judgement Functional Limitation: Mobility: Walking and moving around Mobility: Walking and Moving Around Current Status (D3220): At least 40 percent but less than 60 percent impaired, limited or restricted Mobility: Walking and Moving Around Goal Status [phone]): At least 20 percent but less than 40 percent impaired, limited or restricted    10:37 AM, 01/10/17 Etta Grandchild, PT, DPT Physical Therapist - [place] [phone])  [phone] (mobile)    Jo Bird C 01/10/2017, 10:30 AM

## 2017-01-11 DIAGNOSIS — Z515 Encounter for palliative care: Secondary | ICD-10-CM

## 2017-01-11 DIAGNOSIS — G9340 Encephalopathy, unspecified: Secondary | ICD-10-CM

## 2017-01-11 DIAGNOSIS — N179 Acute kidney failure, unspecified: Principal | ICD-10-CM

## 2017-01-11 DIAGNOSIS — Z7189 Other specified counseling: Secondary | ICD-10-CM

## 2017-01-11 LAB — GLUCOSE, CAPILLARY
GLUCOSE-CAPILLARY: 85 mg/dL (ref 65–99)
GLUCOSE-CAPILLARY: 150 mg/dL — AB (ref 65–99)
Glucose-Capillary: 117 mg/dL — ABNORMAL HIGH (ref 65–99)
Glucose-Capillary: 175 mg/dL — ABNORMAL HIGH (ref 65–99)

## 2017-01-11 LAB — BASIC METABOLIC PANEL
Anion gap: 6 (ref 5–15)
BUN: 39 mg/dL — ABNORMAL HIGH (ref 6–20)
CHLORIDE: 111 mmol/L (ref 101–111)
CO2: 22 mmol/L (ref 22–32)
CREATININE: 1.97 mg/dL — AB (ref 0.44–1.00)
Calcium: 9.3 mg/dL (ref 8.9–10.3)
GFR calc non Af Amer: 22 mL/min — ABNORMAL LOW (ref >60)
GFR, EST AFRICAN AMERICAN: 25 mL/min — AB (ref >60)
Glucose, Bld: 108 mg/dL — ABNORMAL HIGH (ref 65–99)
Potassium: 3.8 mmol/L (ref 3.5–5.1)
SODIUM: 139 mmol/L (ref 135–145)

## 2017-01-11 NOTE — Care Management Important Message (Signed)
Important Message  Patient Details  Name: Jo Bird MRN: [id number] Date of Birth: 08/24/1929   Medicare Important Message Given:  Yes    Shelbie Ammons, RN 01/11/2017, 7:45 AM
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
## 2017-01-11 NOTE — Progress Notes (Signed)
Physical Therapy Treatment Patient Details Name: Jo Bird MRN: [id number] DOB: [birth year] Today's Date: 01/11/2017    History of Present Illness Jo Bird is an 83yo black female who comes to [facility] on 12/11 after worsening confusion and weakness, hypotension at home, and 2 weeks of poor PO intake. PMH: dementia, LtBrCA c current wound managed by daughter, HTN, HLD, DM. Daughter Jo Bird reports that pt was in her usual state of health 3-4 weeks ago, mostly household distance AMB without A/E, no falls or unsteadiness. She has since progressively stopped eating/drinking as much, had more intermittent confusion (especially upon waking in the night), unsteadiness on feet, and weight loss. At baseline pt is independent with ADL, lives with Dtr, Son, and grandson, alone a few hours each day.     PT Comments    Pt was able to transition out of bed and ambulate 140' with walker and min guard/assist.  Generally fatigued with mobility skills showing decreased gait quality upon return to room but no overt buckling or LOB noted.  Should have +1 assist for mobility skills.  Requires verbal cues for hand placements, walker position, to stand up fully and into walker and for general safety.   Follow Up Recommendations  Home health PT;Supervision/Assistance - 24 hour     Equipment Recommendations  Rolling walker with 5" wheels    Recommendations for Other Services       Precautions / Restrictions Precautions Precautions: Fall Restrictions Weight Bearing Restrictions: No    Mobility  Bed Mobility Overal bed mobility: Modified Independent             General bed mobility comments: increased time and use of rail/HOB raised  Transfers Overall transfer level: Needs assistance Equipment used: Rolling walker (2 wheeled) Transfers: Sit to/from Stand Sit to Stand: Min guard         General transfer comment: moderate BUE assist to come to standing. VC's for hand  placements  Ambulation/Gait Ambulation/Gait assistance: Min guard;Min assist Ambulation Distance (Feet): 140 Feet Assistive device: Rolling walker (2 wheeled) Gait Pattern/deviations: Step-to pattern;Narrow base of support   Gait velocity interpretation: <1.8 ft/sec, indicative of risk for recurrent falls General Gait Details: Decreased distance today.  General fatigue but no overt LOB or bucking.   Stairs            Wheelchair Mobility    Modified Rankin (Stroke Patients Only)       Balance Overall balance assessment: Needs assistance Sitting-balance support: Feet supported Sitting balance-Leahy Scale: Good     Standing balance support: Bilateral upper extremity supported Standing balance-Leahy Scale: Fair                              Cognition Arousal/Alertness: Awake/alert Behavior During Therapy: WFL for tasks assessed/performed Overall Cognitive Status: Within Functional Limits for tasks assessed                                        Exercises      General Comments        Pertinent Vitals/Pain Pain Assessment: No/denies pain    Home Living                      Prior Function            PT Goals (current goals can now be found  in the care plan section) Progress towards PT goals: Progressing toward goals    Frequency    Min 2X/week      PT Plan Current plan remains appropriate    Co-evaluation              AM-PAC PT "6 Clicks" Daily Activity  Outcome Measure  Difficulty turning over in bed (including adjusting bedclothes, sheets and blankets)?: A Little Difficulty moving from lying on back to sitting on the side of the bed? : A Little Difficulty sitting down on and standing up from a chair with arms (e.g., wheelchair, bedside commode, etc,.)?: A Little Help needed moving to and from a bed to chair (including a wheelchair)?: A Little Help needed walking in hospital room?: A Little Help  needed climbing 3-5 steps with a railing? : A Lot 6 Click Score: 17    End of Session Equipment Utilized During Treatment: Gait belt Activity Tolerance: Patient limited by fatigue;No increased pain;Patient tolerated treatment well Patient left: in chair;with call bell/phone within reach;with chair alarm set;with family/visitor present         Time: 3500-9381 PT Time Calculation (min) (ACUTE ONLY): 13 min  Charges:  $Gait Training: 8-22 mins                    G Codes:      Chesley Noon, PTA 01/11/17, 10:46 AM

## 2017-01-11 NOTE — Progress Notes (Signed)
[place] [facility] [place] NAME: Jo Bird    MR#:  [id number]  DATE OF BIRTH:  1929-02-13  SUBJECTIVE:  CHIEF COMPLAINT:   Chief Complaint  Patient presents with  . 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 Also have decreased oral intake and weight loss, terminal dementia and worsening confusion last few days. Have some dehydration. Daughter in room.   Stable and at baseline now, sitting in chair.  REVIEW OF SYSTEMS:   Due to dementia pt can not give ROS.  ROS  DRUG ALLERGIES:   Allergies  Allergen Reactions  . Penicillins Other (See Comments)    Has patient had a PCN reaction causing immediate rash, facial/tongue/throat swelling, SOB or lightheadedness with hypotension: No Has patient had a PCN reaction causing severe rash involving mucus membranes or skin necrosis: No Has patient had a PCN reaction that required hospitalization: No Has patient had a PCN reaction occurring within the last 10 years: No If all of the above answers are "NO", then may proceed with Cephalosporin use.  . Pioglitazone Other (See Comments)    edema    VITALS:  Blood pressure (!) 142/59, pulse 64, temperature 98 F (36.7 C), temperature source Oral, resp. rate 16, height 5\' 6"  (1.676 m), weight 91.6 kg (202 lb), SpO2 98 %.  PHYSICAL EXAMINATION:   GENERAL:  83 y.o.-year-old patient lying in the bed with no acute distress.  Peers chronically ill EYES: Pupils equal, round, reactive to light and accommodation. No scleral icterus. Extraocular muscles intact.  HEENT: Head atraumatic, normocephalic. Oropharynx and nasopharynx clear.  NECK:  Supple, no jugular venous distention. No thyroid enlargement, no tenderness.  LUNGS: Normal breath sounds bilaterally, no wheezing, rales,rhonchi or crepitation. No use of accessory muscles of respiration.  CARDIOVASCULAR: S1, S2 normal. No murmurs,  rubs, or gallops.  ABDOMEN: Soft, nontender, nondistended. Bowel sounds present. No organomegaly or mass.  EXTREMITIES: Dry shrunken skin no edema NEUROLOGIC: To examine since patient has dementia.  Overall moves all extremities well PSYCHIATRIC: The patient is alert and awake SKIN: Recent has a chronic left upper chest wound with significant amount of smell and slough tissue.  No surrounding cellulitis.  Wound is under dressing.   Physical Exam LABORATORY PANEL:   CBC Recent Labs  Lab 01/09/17 1028  WBC 8.2  HGB 10.4*  HCT 31.3*  PLT 346   ------------------------------------------------------------------------------------------------------------------  Chemistries  Recent Labs  Lab 01/09/17 1028  01/11/17 0403  NA 140   < > 139  K 4.2   < > 3.8  CL 107   < > 111  CO2 24   < > 22  GLUCOSE 155*   < > 108*  BUN 46*   < > 39*  CREATININE 2.62*   < > 1.97*  CALCIUM 9.6   < > 9.3  AST 14*  --   --   ALT 7*  --   --   ALKPHOS 61  --   --   BILITOT 0.6  --   --    < > = values in this interval not displayed.   ------------------------------------------------------------------------------------------------------------------  Cardiac Enzymes No results for input(s): TROPONINI in the last 168 hours. ------------------------------------------------------------------------------------------------------------------  RADIOLOGY:  No results found.  ASSESSMENT AND PLAN:   Active Problems:   Encephalopathy acute  Jo Bird  is a 83 y.o. 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 Reports patient has overall declined in her well-being with poor appetite, falls, increasing  confusion, difficult ambulation at home.  1.  Acute metabolic encephalopathy multifactorial -CT head negative. -Supportive care. -Patient is alert and awake at present- at baseline now.  2.  Acute on chronic renal failure , CKD stage 3 secondary to poor p.o. intake, failure to thrive, dehydration, patient being on diuretics and other antihypertensive meds -IV fluids.  Monitor I's and O's. -Patient baseline creatinine around 1.9- 1.7  - after fluids now 1.9 again. -Avoid nephrotoxins.  I will hold off on ACE inhibitors  3.  Failure to thrive with progressive decline in overall well-being -Patient's daughter concerned about patient not eating well, falls, increasing confusion likely secondary to dementia worsening - palliative care consultation  4.  Left breast angiosarcoma with radiation causing significant chronic left chest wound -Not appear infected at present.  Significant amount of slough.    wound care consult -Just recently finished a course of doxycycline and Cipro on 01/07/2017  5.  DVT prophylaxis subcu heparin  6.  Generalized weakness physical therapy  Daughter had meeting with palliative care today, agreed for hospice care. SW working on Systems developer for SNF, she may otherwise go home with hospice.  All the records are reviewed and case discussed with Care Management/Social Workerr. Management plans discussed with the patient, family and they are in agreement.  CODE STATUS: Full.  TOTAL TIME TAKING CARE OF THIS PATIENT: 35 minutes.   POSSIBLE D/C IN 1-2 DAYS, DEPENDING ON CLINICAL CONDITION.   Vaughan Basta M.D on 01/11/2017   Between 7am to 6pm - Pager - [phone]  After 6pm go to [email or web address] - password [email or web address] [facility]  Office  [phone]  CC: Primary care physician; Adin Hector, MD  Note: This dictation was prepared with Dragon dictation along with smaller phrase technology. Any transcriptional  errors that result from this process are unintentional.

## 2017-01-11 NOTE — NC FL2 (Signed)
[place] LEVEL OF CARE SCREENING TOOL     IDENTIFICATION  Patient Name: Jo Bird Birthdate: 1929-06-10 Sex: female Admission Date (Current Location): 01/09/2017  [place] and [place] Number:  Jo Bird [id number] [place] and Address:  [facility], [address] [place], [place] [address]      Provider Number: [id number]  Attending Physician Name and Address:  Vaughan Basta, *  Relative Name and Phone Number:  Lana Fish   [phone] or Doylene Bode Daughter [phone]  [phone] or Leane Platt Other   [phone]     Current Level of Care: Hospital Recommended Level of Care: [place] Prior Approval Number:    Date Approved/Denied:   PASRR Number: [id number]  Discharge Plan: SNF    Current Diagnoses: Patient Active Problem List   Diagnosis Date Noted  . Encephalopathy acute 01/09/2017    Orientation RESPIRATION BLADDER Height & Weight     Self  Normal Continent Weight: 202 lb (91.6 kg) Height:  5\' 6"  (167.6 cm)  BEHAVIORAL SYMPTOMS/MOOD NEUROLOGICAL BOWEL NUTRITION STATUS      Continent Diet(Carb modified)  AMBULATORY STATUS COMMUNICATION OF NEEDS Skin   Limited Assist Verbally Surgical wounds                       Personal Care Assistance Level of Assistance  Bathing, Feeding, Dressing Bathing Assistance: Limited assistance Feeding assistance: Limited assistance Dressing Assistance: Limited assistance     Functional Limitations Info  Sight, Hearing, Speech Sight Info: Adequate Hearing Info: Adequate Speech Info: Adequate    SPECIAL CARE FACTORS FREQUENCY  PT (By licensed PT)     PT Frequency: 2x a week home health              Contractures Contractures Info: Not present    Additional Factors Info  Code Status, Allergies, Psychotropic, Insulin Sliding Scale Code Status Info: DNR Allergies Info: PENICILLINS, PIOGLITAZONE  Psychotropic Info:  busPIRone (BUSPAR) tablet 15 mg  Insulin Sliding Scale Info: insulin aspart (novoLOG) injection 0-9 Units 3x a day with meals       Current Medications (01/11/2017):  This is the current hospital active medication list Current Facility-Administered Medications  Medication Dose Route Frequency Provider Last Rate Last Dose  . acetaminophen (TYLENOL) tablet 650 mg  650 mg Oral Q6H PRN Fritzi Mandes, MD   650 mg at 01/10/17 0558   Or  . acetaminophen (TYLENOL) suppository 650 mg  650 mg Rectal Q6H PRN Fritzi Mandes, MD      . atorvastatin (LIPITOR) tablet 40 mg  40 mg Oral q1800 Fritzi Mandes, MD   40 mg at 01/10/17 1815  . busPIRone (BUSPAR) tablet 15 mg  15 mg Oral BID Fritzi Mandes, MD   15 mg at 01/11/17 0853  . carbamide peroxide (DEBROX) 6.5 % OTIC (EAR) solution 5 drop  5 drop Both EARS BID Fritzi Mandes, MD   5 drop at 01/10/17 2032  . cholecalciferol (VITAMIN D) tablet 400 Units  400 Units Oral Daily Fritzi Mandes, MD   400 Units at 01/11/17 [phone]  . donepezil (ARICEPT) tablet 23 mg  23 mg Oral QHS Fritzi Mandes, MD   23 mg at 01/10/17 2031  . insulin aspart (novoLOG) injection 0-9 Units  0-9 Units Subcutaneous TID WC Fritzi Mandes, MD   2 Units at 01/10/17 1203  . insulin glargine (LANTUS) injection 10 Units  10 Units Subcutaneous QHS Vaughan Basta, MD   10 Units at 01/10/17 2031  .  memantine (NAMENDA) tablet 5 mg  5 mg Oral BID Fritzi Mandes, MD   5 mg at 01/11/17 0853  . multivitamin with minerals tablet 1 tablet  1 tablet Oral Daily Vaughan Basta, MD   1 tablet at 01/11/17 0853  . nebivolol (BYSTOLIC) tablet 10 mg  10 mg Oral Daily Fritzi Mandes, MD   10 mg at 01/11/17 0851  . ondansetron (ZOFRAN) tablet 4 mg  4 mg Oral Q6H PRN Fritzi Mandes, MD       Or  . ondansetron [facility]) injection 4 mg  4 mg Intravenous Q6H PRN Fritzi Mandes, MD      . pantoprazole (PROTONIX) EC tablet 40 mg  40 mg Oral Daily Fritzi Mandes, MD   40 mg at 01/11/17 0853  . polyethylene glycol (MIRALAX / GLYCOLAX) packet  17 g  17 g Oral Daily PRN Fritzi Mandes, MD      . protein supplement (PREMIER PROTEIN) liquid  11 oz Oral BID BM Vaughan Basta, MD      . senna (SENOKOT) tablet 8.6 mg  1 tablet Oral BID Fritzi Mandes, MD   8.6 mg at 01/11/17 0854  . sodium hypochlorite (DAKIN'S 1/2 STRENGTH) topical solution 1 application  1 application Irrigation Daily Vaughan Basta, MD   1 application at 08/67/61 [phone]  . terazosin (HYTRIN) capsule 10 mg  10 mg Oral QHS Fritzi Mandes, MD   10 mg at 01/10/17 2030  . vitamin B-12 (CYANOCOBALAMIN) tablet 500 mcg  500 mcg Oral Daily Fritzi Mandes, MD   500 mcg at 01/11/17 3267     Discharge Medications: Please see discharge summary for a list of discharge medications.  Relevant Imaging Results:  Relevant Lab Results:   Additional Information SSN [id number]  Ross Ludwig, [place]

## 2017-01-11 NOTE — Clinical Social Work Note (Signed)
Clinical Social Work Assessment  Patient Details  Name: Jo Bird MRN: [id number] Date of Birth: December 18, 1929  Date of referral:  01/11/17               Reason for consult:  Facility Placement                Permission sought to share information with:  Family Supports, Customer service manager Permission granted to share information::  Yes, Verbal Permission Granted  Name::     Lana Fish   [phone] or Doylene Bode Daughter [phone]  [phone] or Leane Platt Other   [phone]   Agency::  SNF admissions  Relationship::     Contact Information:     Housing/Transportation Living arrangements for the past 2 months:  Single Family Home Source of Information:  Adult Children, Other (Comment Required)(Patient's grandson) Patient Interpreter Needed:  None Criminal Activity/Legal Involvement Pertinent to Current Situation/Hospitalization:  No - Comment as needed Significant Relationships:  Adult Children, Other Family Members Lives with:  Adult Children Do you feel safe going back to the place where you live?  No Need for family participation in patient care:  Yes (Comment)  Care giving concerns:  Patient's family feel she needs placement because her health is not improving.  Patient's family have agreed to have patient go to SNF and transition to long term care with hospice to follow.   Social Worker assessment / plan:  Patient is an 83 year old female who is widowed and alert and oriented x1.  Patient has dementia, and has been living with her daughter.  Patient's daughter reports that patient was at rehab a few years ago at [facility].  Patient's daughter Davy Pique met with palliative today, and they have decided to have patient go to SNF under rehab with palliative to follow, and then transition to long term care, with hospice to follow.  CSW explained role of CSW and process of trying to find placement for patient.  CSW informed patient's family that  there will be a limited amount of SNFs that have long term care beds which take Medicaid.  Patient's family expressed understanding, CSW explained how insurance will pay for stay, and the process of having patient transition to long term care.  Patient's family gave CSW permission to begin bed search in [facility], the patient's family did not have any other questions or concerns.  Employment status:  Retired Nurse, adult PT Recommendations:  Home with [place] / Referral to community resources:  [place]  Patient/Family's Response to care:  Patient and family agreeable to going to SNF for short term rehab with palliative, then transition to long term care  Patient/Family's Understanding of and Emotional Response to Diagnosis, Current Treatment, and Prognosis:  Patient's family is aware that patient has a poor prognosis, and they have agreed to have patient transition to long term care at [facility] and eventually have hospice follow her.  Emotional Assessment Appearance:  Appears stated age Attitude/Demeanor/Rapport:    Affect (typically observed):  Appropriate, Calm Orientation:  Oriented to Self Alcohol / Substance use:  Not Applicable Psych involvement (Current and /or in the community):  No (Comment)  Discharge Needs  Concerns to be addressed:  Lack of Support Readmission within the last 30 days:  No Current discharge risk:  Lack of support system Barriers to Discharge:  Continued Medical Work up   Anell Barr 01/11/2017, 5:07 PM

## 2017-01-12 LAB — GLUCOSE, CAPILLARY
Glucose-Capillary: 152 mg/dL — ABNORMAL HIGH (ref 65–99)
Glucose-Capillary: 182 mg/dL — ABNORMAL HIGH (ref 65–99)

## 2017-01-12 MED ORDER — DAKINS (1/2 STRENGTH) 0.25 % EX SOLN: 1.0000 "application " | Freq: Every day | CUTANEOUS | 0 refills | Status: AC

## 2017-01-12 MED ORDER — FUROSEMIDE 40 MG PO TABS: 80.0000 mg | ORAL_TABLET | Freq: Every day | ORAL | 0 refills | Status: AC

## 2017-01-12 MED ORDER — PREMIER PROTEIN SHAKE: 11.0000 [oz_av] | Freq: Two times a day (BID) | ORAL | 0 refills | Status: AC

## 2017-01-12 MED ORDER — INSULIN GLARGINE 100 UNIT/ML ~~LOC~~ SOLN: 10.0000 [IU] | Freq: Every day | SUBCUTANEOUS | 11 refills | Status: AC

## 2017-01-12 MED ORDER — ADULT MULTIVITAMIN W/MINERALS CH: 1.0000 | ORAL_TABLET | Freq: Every day | ORAL | 0 refills | Status: AC

## 2017-01-12 NOTE — Clinical Social Work Note (Signed)
MD discharged patient today to return to [facility]. CSW spoke with her daughter and she is in agreement. Discharge information sent to [facility] and Juliann Pulse aware at [facility] of patient's discharge. Nurse called report and then EMS. Shela Leff MSW,LCSW [phone]

## 2017-01-12 NOTE — Discharge Summary (Addendum)
[facility] [place] NAME: Jo Bird    MR#:  [id number]  DATE OF BIRTH:  1929/04/14  DATE OF ADMISSION:  01/09/2017 ADMITTING PHYSICIAN: Fritzi Mandes, MD  DATE OF DISCHARGE: 01/12/2017  PRIMARY CARE PHYSICIAN: Adin Hector, MD    ADMISSION DIAGNOSIS:  Encephalopathy acute [G93.40] AKI (acute kidney injury) ([place]) [N17.9]  DISCHARGE DIAGNOSIS:  Active Problems:   Encephalopathy acute   SECONDARY DIAGNOSIS:   Past Medical History:  Diagnosis Date  . 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 Arthritis   . Cancer ([place])    left breast  . Chronic kidney disease (CKD), stage III (moderate) ([place])   . Dementia   . Diabetes mellitus without complication ([place])   . Heart murmur   . Hypercalcemia   . Hyperlipemia   . Hypertension   . Leg edema   . 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 Reports patient has overall declined in her well-being with poor appetite, falls, increasing confusion, difficult ambulation at home.  1. Acute metabolic encephalopathy multifactorial -CT head negative. -Supportive care. -Patient is alert and awake at present- at baseline now.  2. Acute on chronic renal failure , CKD stage 3 secondary to poor p.o. intake, failure to thrive, dehydration, patient being on diuretics and other antihypertensive meds -IV fluids. Monitor I's and O's. -Patient baseline creatinine around 1.9- 1.7 - after fluids now 1.9 again. -Avoid nephrotoxins. I will hold off on ACE inhibitors  3. Failure to thrive with progressive decline in overall  well-being -Patient's daughter concerned about patient not eating well, falls, increasing confusion likely secondary to dementia worsening - palliative care consultation  4. Left breast angiosarcoma with radiation causing significant chronic left chest wound -Not appear infected at present. Significant amount of slough.   wound care consult appreciated -Just recently finished a course of doxycycline and Cipro on 01/07/2017 - plan as per wound care    Wound type: cancerous lesion to left breast and chest wall.  Pressure Injury POA: NA Measurement: 8 cm x 10 cm x 2.4 cm  Wound bed:75% devitalized tissue 25 % pale pink nongranulating Drainage (amount, consistency, odor) Minimal serosanguinous  Strong, necrotic, sour smell.  Periwound:intact Dressing procedure/placement/frequency: Cleanse left breast wound with NS and pat gently dry.  Apply barrier cream to the periwound skin to avoid maceration.  Gently fill wound depth with dakins moist gauze.  Cover with ABD pad and tape.  Change daily.    5. DVT prophylaxis subcu heparin  6. Generalized weakness physical therapy     DISCHARGE CONDITIONS:   Stable.  CONSULTS OBTAINED:    DRUG ALLERGIES:   Allergies  Allergen Reactions  . Penicillins Other (See Comments)    Has patient had a PCN reaction causing immediate rash, facial/tongue/throat swelling, SOB or lightheadedness with hypotension: No Has patient had a PCN reaction causing severe rash involving mucus membranes or skin necrosis: No Has patient had a PCN reaction that required hospitalization: No Has patient had a PCN reaction occurring within the last 10 years: No If all of the above answers are "NO", then may proceed with Cephalosporin use.  . Pioglitazone Other (  See Comments)    edema    DISCHARGE MEDICATIONS:   Allergies as of 01/12/2017      Reactions   Penicillins Other (See Comments)   Has patient had a PCN reaction causing immediate rash,  facial/tongue/throat swelling, SOB or lightheadedness with hypotension: No Has patient had a PCN reaction causing severe rash involving mucus membranes or skin necrosis: No Has patient had a PCN reaction that required hospitalization: No Has patient had a PCN reaction occurring within the last 10 years: No If all of the above answers are "NO", then may proceed with Cephalosporin use.   Pioglitazone Other (See Comments)   edema      Medication List    STOP taking these medications   BLOOD GLUCOSE TEST STRIPS Strp   ciprofloxacin 250 MG tablet Commonly known as:  CIPRO   doxycycline 100 MG tablet Commonly known as:  VIBRA-TABS   LANTUS SOLOSTAR 100 UNIT/ML Solostar Pen Generic drug:  Insulin Glargine Replaced by:  insulin glargine 100 UNIT/ML injection   lisinopril 40 MG tablet Commonly known as:  PRINIVIL,ZESTRIL     TAKE these medications   busPIRone 15 MG tablet Commonly known as:  BUSPAR Take 15 mg by mouth 2 (two) times daily.   cholecalciferol 400 units Tabs tablet Commonly known as:  VITAMIN D Take 400 Units by mouth.   cyanocobalamin 500 MCG tablet Take 500 mcg by mouth daily.   donepezil 23 MG Tabs tablet Commonly known as:  ARICEPT Take 1 tablet by mouth at bedtime.   esomeprazole 40 MG capsule Commonly known as:  NEXIUM Take 40 mg by mouth daily at 12 noon.   fexofenadine 180 MG tablet Commonly known as:  ALLEGRA Take 180 mg by mouth daily.   furosemide 40 MG tablet Commonly known as:  LASIX Take 2 tablets (80 mg total) by mouth daily. What changed:  medication strength   glipiZIDE 10 MG tablet Commonly known as:  GLUCOTROL Take 10 mg by mouth daily before breakfast.   insulin glargine 100 UNIT/ML injection Commonly known as:  LANTUS Inject 0.1 mLs (10 Units total) into the skin at bedtime. Replaces:  LANTUS SOLOSTAR 100 UNIT/ML Solostar Pen   memantine 5 MG tablet Commonly known as:  NAMENDA Take 5 mg by mouth 2 (two) times daily.    multivitamin with minerals Tabs tablet Take 1 tablet by mouth daily.   nebivolol 10 MG tablet Commonly known as:  BYSTOLIC Take 10 mg by mouth daily.   olmesartan 40 MG tablet Commonly known as:  BENICAR Take 40 mg by mouth daily.   potassium chloride 10 MEQ tablet Commonly known as:  K-DUR,KLOR-CON Take 10 mEq by mouth daily.   protein supplement shake Liqd Commonly known as:  PREMIER PROTEIN Take 325 mLs (11 oz total) by mouth 2 (two) times daily between meals.   simvastatin 80 MG tablet Commonly known as:  ZOCOR Take 80 mg by mouth daily.   sodium hypochlorite external solution Commonly known as:  DAKIN'S 1/2 STRENGTH Irrigate with 1 application as directed daily.   terazosin 10 MG capsule Commonly known as:  HYTRIN Take 10 mg by mouth at bedtime.        DISCHARGE INSTRUCTIONS:    Wound type: cancerous lesion to left breast and chest wall.  Pressure Injury POA: NA Measurement: 8 cm x 10 cm x 2.4 cm  Wound bed:75% devitalized tissue 25 % pale pink nongranulating Drainage (amount, consistency, odor) Minimal serosanguinous  Strong, necrotic, sour smell.  Periwound:intact  Dressing procedure/placement/frequency: Cleanse left breast wound with NS and pat gently dry.  Apply barrier cream to the periwound skin to avoid maceration.  Gently fill wound depth with dakins moist gauze.  Cover with ABD pad and tape.  Change daily.   Palliative care to follow at [facility].  If you experience worsening of your admission symptoms, develop shortness of breath, life threatening emergency, suicidal or homicidal thoughts you must seek medical attention immediately by calling 911 or calling your MD immediately  if symptoms less severe.  You Must read complete instructions/literature along with all the possible adverse reactions/side effects for all the Medicines you take and that have been prescribed to you. Take any new Medicines after you have completely understood and accept all the  possible adverse reactions/side effects.   Please note  You were cared for by a hospitalist during your hospital stay. If you have any questions about your discharge medications or the care you received while you were in the hospital after you are discharged, you can call the unit and asked to speak with the hospitalist on call if the hospitalist that took care of you is not available. Once you are discharged, your primary care physician will handle any further medical issues. Please note that NO REFILLS for any discharge medications will be authorized once you are discharged, as it is imperative that you return to your primary care physician (or establish a relationship with a primary care physician if you do not have one) for your aftercare needs so that they can reassess your need for medications and monitor your lab values.    Today   CHIEF COMPLAINT:   Chief Complaint  Patient presents with  . Altered Mental Status    HISTORY OF PRESENT ILLNESS:  Jo Bird  is a 83 y.o. 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 Reports patient has overall declined in her well-being with poor appetite, falls, increasing confusion, difficult ambulation at home. She recently finished a 10-day course from 1129-12 9 with doxycycline and Cipro for her left breast chronic open wound. She does not have any fever present white count is normal.  Wound is foul-smelling. Pt appears cachectic malnourished  She was found to have creatinine of 2.62.  She also is taking diuretics at home and several other medications that could have affected her kidney numbers including poor p.o. intake.  She is being admitted for  further evaluation and management   VITAL SIGNS:  Blood pressure (!) 136/53, pulse (!) 56, temperature 98.4 F (36.9 C), temperature source Oral, resp. rate 14, height 5\' 6"  (1.676 m), weight 91.6 kg (202 lb), SpO2 98 %.  I/O:    Intake/Output Summary (Last 24 hours) at 01/12/2017 0803 Last data filed at 01/11/2017 1300 Gross per 24 hour  Intake 720 ml  Output -  Net 720 ml    PHYSICAL EXAMINATION:   GENERAL:83 y.o.-year-old patient lying in the bed with no acute distress.Peers chronically ill EYES: Pupils equal, round, reactive to light and accommodation. No scleral icterus. Extraocular muscles intact.  HEENT: Head atraumatic, normocephalic. Oropharynx and nasopharynx clear.  NECK: Supple, no jugular venous distention. No thyroid enlargement, no tenderness.  LUNGS: Normal breath sounds bilaterally, no wheezing, rales,rhonchi or crepitation. No use of accessory muscles  of respiration.  CARDIOVASCULAR: S1, S2 normal. No murmurs, rubs, or gallops.  ABDOMEN: Soft, nontender, nondistended. Bowel sounds present. No organomegaly or mass.  EXTREMITIES:Dry shrunken skin no edema NEUROLOGIC:To examine since patient has dementia. Overall moves all extremities well PSYCHIATRIC: The patient is alertand awake SKIN:Recent has a chronic left upper chest wound with significant amount of smell and slough tissue. No surrounding cellulitis. Wound is under dressing.    DATA REVIEW:   CBC Recent Labs  Lab 01/09/17 1028  WBC 8.2  HGB 10.4*  HCT 31.3*  PLT 346    Chemistries  Recent Labs  Lab 01/09/17 1028  01/11/17 0403  NA 140   < > 139  K 4.2   < > 3.8  CL 107   < > 111  CO2 24   < > 22  GLUCOSE 155*   < > 108*  BUN 46*   < > 39*  CREATININE 2.62*   < > 1.97*  CALCIUM 9.6   < > 9.3  AST 14*  --   --   ALT 7*  --   --   ALKPHOS 61  --   --   BILITOT 0.6  --   --    < > = values in this interval not displayed.    Cardiac Enzymes No results for input(s):  TROPONINI in the last 168 hours.  Microbiology Results  No results found for this or any previous visit.  RADIOLOGY:  No results found.  EKG:   Orders placed or performed during the hospital encounter of 01/09/17  . EKG 12-Lead  . EKG 12-Lead  . ED EKG  . ED EKG  . EKG    Management plans discussed with the patient, family and they are in agreement.  CODE STATUS:     Code Status Orders  (From admission, onward)        Start     Ordered   01/11/17 1134  Do not attempt resuscitation (DNR)  Continuous    Question Answer Comment  In the event of cardiac or respiratory ARREST Do not call a "code blue"   In the event of cardiac or respiratory ARREST Do not perform Intubation, CPR, defibrillation or ACLS   In the event of cardiac or respiratory ARREST Use medication by any route, position, wound care, and other measures to relive pain and suffering. May use oxygen, suction and manual treatment of airway obstruction as needed for comfort.      01/11/17 1133    Code Status History    Date Active Date Inactive Code Status Order ID Comments User Context   01/09/2017 14:12 01/11/2017 11:33 Full Code [id number]  Fritzi Mandes, MD Inpatient      TOTAL TIME TAKING CARE OF THIS PATIENT: 35 minutes.    Vaughan Basta M.D on 01/12/2017 at 8:03 AM  Between 7am to 6pm - Pager - [phone]  After 6pm go to [email or web address] - password [email or web address] [place] [facility]  Office  [phone]  CC: Primary care physician; Adin Hector, MD   Note: This dictation was prepared with Dragon dictation along with smaller phrase technology. Any transcriptional errors that result from this process are unintentional.

## 2017-01-12 NOTE — Progress Notes (Signed)
Pt for discharge to [facility] via [facility]. Alert. No resp distress. Pt will have pall care at [facility].  Sl d/cd. Ems called for transport/  Report called to melinda at [facility].

## 2017-01-12 NOTE — Plan of Care (Signed)
  Progressing Health Behavior/Discharge Planning: Ability to manage health-related needs will improve 01/12/2017 0956 - Progressing by Rowe Robert, RN Clinical Measurements: Ability to maintain clinical measurements within normal limits will improve 01/12/2017 0956 - Progressing by Rowe Robert, RN Activity: Risk for activity intolerance will decrease 01/12/2017 0956 - Progressing by Rowe Robert, RN Nutrition: Adequate nutrition will be maintained 01/12/2017 0956 - Progressing by Rowe Robert, RN Pain Managment: General experience of comfort will improve 01/12/2017 0956 - Progressing by Rowe Robert, RN Safety: Ability to remain free from injury will improve 01/12/2017 0956 - Progressing by Rowe Robert, RN

## 2017-01-12 NOTE — Clinical Social Work Placement (Signed)
   CLINICAL SOCIAL WORK PLACEMENT  NOTE  Date:  01/12/2017  Patient Details  Name: Jo Bird MRN: [id number] Date of Birth: Oct 17, 1929  Clinical Social Work is seeking post-discharge placement for this patient at the [place] level of care (*CSW will initial, date and re-position this form in  chart as items are completed):  Yes   Patient/family provided with [facility]'s list of facilities offering this level of care within the geographic area requested by the patient (or if unable, by the patient's family).  Yes   Patient/family informed of their freedom to choose among providers that offer the needed level of care, that participate in Medicare, Medicaid or managed care program needed by the patient, have an available bed and are willing to accept the patient.  Yes   Patient/family informed of [facility]'s ownership interest in [facility] and [facility], as well as of the fact that they are under no obligation to receive care at these facilities.  PASRR submitted to EDS on 01/11/17     PASRR number received on 01/11/17     Existing PASRR number confirmed on       FL2 transmitted to all facilities in geographic area requested by pt/family on 01/11/17     FL2 transmitted to all facilities within larger geographic area on       Patient informed that his/her managed care company has contracts with or will negotiate with certain facilities, including the following:        Yes   Patient/family informed of bed offers received.  Patient chooses bed at [facility]     Physician recommends and patient chooses bed at      Patient to be transferred to [facility] on  .  Patient to be transferred to facility by [facility]     Patient family notified on 01/12/17 of transfer.  Name of family member notified:  daughter     PHYSICIAN Please sign FL2, Please sign DNR     Additional Comment:     _______________________________________________ Shela Leff, LCSW 01/12/2017, 2:28 PM

## 2017-01-12 NOTE — Progress Notes (Signed)
New referral for out patient Palliative to follow at [facility] received from [place]. Plan is for discharge today. Patient  information faxed to referral. Flo Shanks RN, BSN, [place] [facility] liaison [phone]

## 2017-01-12 NOTE — Discharge Instructions (Signed)
Palliative care to follow at [facility].

## 2017-04-30 ENCOUNTER — Encounter: Payer: Medicare Other | Attending: Physician Assistant | Admitting: Physician Assistant

## 2017-04-30 ENCOUNTER — Other Ambulatory Visit
Admission: RE | Admit: 2017-04-30 | Discharge: 2017-04-30 | Disposition: A | Discharge status: Home / Self Care | Payer: Medicare Other | Source: Ambulatory Visit | Attending: Physician Assistant | Admitting: Physician Assistant

## 2017-04-30 DIAGNOSIS — N183 Chronic kidney disease, stage 3 (moderate): Secondary | ICD-10-CM | POA: Diagnosis not present

## 2017-04-30 DIAGNOSIS — I129 Hypertensive chronic kidney disease with stage 1 through stage 4 chronic kidney disease, or unspecified chronic kidney disease: Secondary | ICD-10-CM | POA: Diagnosis not present

## 2017-04-30 DIAGNOSIS — B999 Unspecified infectious disease: Secondary | ICD-10-CM | POA: Diagnosis present

## 2017-04-30 DIAGNOSIS — E1122 Type 2 diabetes mellitus with diabetic chronic kidney disease: Secondary | ICD-10-CM | POA: Insufficient documentation

## 2017-04-30 DIAGNOSIS — L98492 Non-pressure chronic ulcer of skin of other sites with fat layer exposed: Secondary | ICD-10-CM | POA: Insufficient documentation

## 2017-04-30 DIAGNOSIS — E11622 Type 2 diabetes mellitus with other skin ulcer: Secondary | ICD-10-CM | POA: Insufficient documentation

## 2017-04-30 DIAGNOSIS — E785 Hyperlipidemia, unspecified: Secondary | ICD-10-CM | POA: Insufficient documentation

## 2017-04-30 DIAGNOSIS — F0390 Unspecified dementia without behavioral disturbance: Secondary | ICD-10-CM | POA: Insufficient documentation

## 2017-04-30 DIAGNOSIS — M1990 Unspecified osteoarthritis, unspecified site: Secondary | ICD-10-CM | POA: Diagnosis not present

## 2017-04-30 DIAGNOSIS — C50912 Malignant neoplasm of unspecified site of left female breast: Secondary | ICD-10-CM | POA: Diagnosis not present

## 2017-04-30 DIAGNOSIS — Z880 Allergy status to penicillin: Secondary | ICD-10-CM | POA: Insufficient documentation

## 2017-04-30 DIAGNOSIS — N61 Mastitis without abscess: Secondary | ICD-10-CM | POA: Insufficient documentation

## 2017-05-03 LAB — AEROBIC CULTURE W GRAM STAIN (SUPERFICIAL SPECIMEN)

## 2017-05-03 LAB — AEROBIC CULTURE  (SUPERFICIAL SPECIMEN)

## 2017-05-04 NOTE — Progress Notes (Signed)
Jo Bird, Jo Bird ([id number]) Visit Report for 04/30/2017 Abuse/Suicide Risk Screen Details Patient Name: Jo Bird, Jo Bird Date of Service: 04/30/2017 9:45 AM Medical Record Number: [id number] Patient Account Number: [id number] Date of Birth/Sex: 02/18/1929 (84 y.o. Female) Treating RN: Carolyne Fiscal, Debi Primary Care Mir Fullilove: Ramonita Lab Other Clinician: Referring Ky Rumple: Maryella Shivers Treating Dewight Catino/Extender: Melburn Hake, HOYT Weeks in Treatment: 0 Abuse/Suicide Risk Screen Items Answer Notes unable to answer all questions because of mental status Electronic Signature(s) Signed: 05/03/2017 4:32:30 PM By: Alric Quan Entered By: Alric Quan on 04/30/2017 10:19:53 Jo Bird ([id number]) -------------------------------------------------------------------------------- Activities of Daily Living Details Patient Name: Jo Bird Date of Service: 04/30/2017 9:45 AM Medical Record Number: [id number] Patient Account Number: [id number] Date of Birth/Sex: Feb 15, 1929 (84 y.o. Female) Treating RN: Carolyne Fiscal, Debi Primary Care Chellsie Gomer: Ramonita Lab Other Clinician: Referring Dora Clauss: Maryella Shivers Treating Ramesha Poster/Extender: Melburn Hake, HOYT Weeks in Treatment: 0 Activities of Daily Living Items Answer Activities of Daily Living (Please select one for each item) Drive Automobile Not Able Take Medications Not Able Use Telephone Need Assistance Care for Appearance Need Assistance Use Toilet Need Assistance Bath / Shower Need Assistance Dress Self Need Assistance Feed Self Need Assistance Walk Need Assistance Get In / Out Bed Need Assistance Housework Need Assistance Prepare Meals Need Assistance Handle Money Need Assistance Shop for Self Need Assistance Electronic Signature(s) Signed: 05/03/2017 4:32:30 PM By: Alric Quan Entered By: Alric Quan on 04/30/2017 10:20:25 Jo Bird  ([id number]) -------------------------------------------------------------------------------- Education Assessment Details Patient Name: Jo Bird Date of Service: 04/30/2017 9:45 AM Medical Record Number: [id number] Patient Account Number: [id number] Date of Birth/Sex: 09/24/1929 (84 y.o. Female) Treating RN: Carolyne Fiscal, Debi Primary Care Vernor Monnig: Ramonita Lab Other Clinician: Referring Adore Kithcart: Maryella Shivers Treating Sriman Tally/Extender: Melburn Hake, HOYT Weeks in Treatment: 0 Primary Learner Assessed: Patient Learning Preferences/Education Level/Primary Language Learning Preference: Explanation, Printed Material Highest Education Level: High School Preferred Language: English Cognitive Barrier Assessment/Beliefs Language Barrier: No Translator Needed: No Physical Barrier Assessment Impaired Vision: No Impaired Hearing: Yes HOH Decreased Hand dexterity: No Knowledge/Comprehension Assessment Knowledge Level: Low Comprehension Level: Low Ability to understand written Low instructions: Ability to understand verbal Low instructions: Motivation Assessment Anxiety Level: Calm Cooperation: Cooperative Education Importance: Acknowledges Need Interest in Health Problems: Uninterested Perception: Confused Willingness to Engage in Self- Low Management Activities: Readiness to Engage in Self- Low Management Activities: Electronic Signature(s) Signed: 05/03/2017 4:32:30 PM By: Alric Quan Entered By: Alric Quan on 04/30/2017 10:21:07 Jo Bird ([id number]) -------------------------------------------------------------------------------- Foot Assessment Details Patient Name: Jo Bird Date of Service: 04/30/2017 9:45 AM Medical Record Number: [id number] Patient Account Number: [id number] Date of Birth/Sex: 12-Jul-1929 (84 y.o. Female) Treating RN: Carolyne Fiscal, Debi Primary Care Donovin Kraemer: Ramonita Lab Other Clinician: Referring Azuree Minish: Maryella Shivers Treating Vitaly Wanat/Extender: Melburn Hake, HOYT Weeks in Treatment: 0 Foot Assessment Items Site Locations + = Sensation present, - = Sensation absent, C = Callus, U = Ulcer R = Redness, W = Warmth, M = Maceration, PU = Pre-ulcerative lesion F = Fissure, S = Swelling, D = Dryness Assessment Right: Left: Other Deformity: No No Prior Foot Ulcer: No No Prior Amputation: No No Charcot Joint: No No Ambulatory Status: Gait: Electronic Signature(s) Signed: 05/03/2017 4:32:30 PM By: Alric Quan Entered By: Alric Quan on 04/30/2017 10:21:49 Jo Bird ([id number]) -------------------------------------------------------------------------------- Nutrition Risk Assessment Details Patient Name: Jo Bird Date of Service: 04/30/2017 9:45 AM Medical Record Number: [id number] Patient Account Number: [id number] Date of Birth/Sex: [birth year] (84 y.o. Female) Treating RN: Ahmed Prima Primary Care Vanesa Renier: Caryl Comes,  BERT Other Clinician: Referring Chantilly Linskey: Maryella Shivers Treating Gregoire Bennis/Extender: STONE III, HOYT Weeks in Treatment: 0 Height (in): Weight (lbs): Body Mass Index (BMI): Nutrition Risk Assessment Items NUTRITION RISK SCREEN: I have an illness or condition that made me change the kind and/or amount of 0 No food I eat I eat fewer than two meals per day 0 No I eat few fruits and vegetables, or milk products 0 No I have three or more drinks of beer, liquor or wine almost every day 0 No I have tooth or mouth problems that make it hard for me to eat 0 No I don't always have enough money to buy the food I need 0 No I eat alone most of the time 0 No I take three or more different prescribed or over-the-counter drugs a day 0 No Without wanting to, I have lost or gained 10 pounds in the last six months 0 No I am not always physically able to shop, cook and/or feed myself 0 No Nutrition Protocols Good Risk Protocol Moderate Risk Protocol Notes unable to  answer all questions because of mental status Electronic Signature(s) Signed: 05/03/2017 4:32:30 PM By: Alric Quan Entered By: Alric Quan on 04/30/2017 10:21:43

## 2017-05-04 NOTE — Progress Notes (Addendum)
Jo Bird, Jo Bird ([id number]) Visit Report for 04/30/2017 Allergy List Details Patient Name: Jo Bird, Jo Bird Date of Service: 04/30/2017 9:45 AM Medical Record Number: [id number] Patient Account Number: [id number] Date of Birth/Sex: 11-07-1929 (84 y.o. Female) Treating RN: Ahmed Prima Primary Care Krizia Flight: Ramonita Lab Other Clinician: Referring Iridiana Fonner: Maryella Shivers Treating Zaylin Runco/Extender: STONE III, HOYT Weeks in Treatment: 0 Allergies Active Allergies penicillin pioglitazone Allergy Notes Electronic Signature(s) Signed: 04/30/2017 10:42:36 AM By: Alric Quan Entered By: Alric Quan on 04/30/2017 10:42:36 Jo Bird ([id number]) -------------------------------------------------------------------------------- Arrival Information Details Patient Name: Jo Bird Date of Service: 04/30/2017 9:45 AM Medical Record Number: [id number] Patient Account Number: [id number] Date of Birth/Sex: [birth year] (84 y.o. Female) Treating RN: Carolyne Fiscal, Debi Primary Care Camron Essman: Ramonita Lab Other Clinician: Referring Pedro Whiters: Maryella Shivers Treating Linden Tagliaferro/Extender: Melburn Hake, HOYT Weeks in Treatment: 0 Visit Information Patient Arrived: Wheel Chair Arrival Time: 09:59 Accompanied By: self Transfer Assistance: EasyPivot Patient Lift Patient Identification Verified: Yes Secondary Verification Process Yes Completed: Patient Requires Transmission-Based No Precautions: Patient Has Alerts: Yes Patient Alerts: DMII Dementia HOH Electronic Signature(s) Signed: 04/30/2017 10:42:57 AM By: Alric Quan Entered By: Alric Quan on 04/30/2017 10:42:57 Jo Bird ([id number]) -------------------------------------------------------------------------------- Clinic Level of Care Assessment Details Patient Name: Jo Bird Date of Service: 04/30/2017 9:45 AM Medical Record Number: [id number] Patient Account Number: [id number] Date of Birth/Sex: 01/18/1930 (84  y.o. Female) Treating RN: Roger Shelter Primary Care Rae Plotner: Ramonita Lab Other Clinician: Referring Destaney Sarkis: Maryella Shivers Treating Terril Chestnut/Extender: Melburn Hake, HOYT Weeks in Treatment: 0 Clinic Level of Care Assessment Items TOOL 2 Quantity Score X - Use when only an EandM is performed on the INITIAL visit 1 0 ASSESSMENTS - Nursing Assessment / Reassessment X - General Physical Exam (combine w/ comprehensive assessment (listed just below) when 1 20 performed on new pt. evals) X- 1 25 Comprehensive Assessment (HX, ROS, Risk Assessments, Wounds Hx, etc.) ASSESSMENTS - Wound and Skin Assessment / Reassessment X - Simple Wound Assessment / Reassessment - one wound 1 5 []  - 0 Complex Wound Assessment / Reassessment - multiple wounds []  - 0 Dermatologic / Skin Assessment (not related to wound area) ASSESSMENTS - Ostomy and/or Continence Assessment and Care []  - Incontinence Assessment and Management 0 []  - 0 Ostomy Care Assessment and Management (repouching, etc.) PROCESS - Coordination of Care X - Simple Patient / Family Education for ongoing care 1 15 []  - 0 Complex (extensive) Patient / Family Education for ongoing care []  - 0 Staff obtains Programmer, systems, Records, Test Results / Process Orders []  - 0 Staff telephones HHA, Nursing Homes / Clarify orders / etc []  - 0 Routine Transfer to another Facility (non-emergent condition) []  - 0 Routine Hospital Admission (non-emergent condition) []  - 0 New Admissions / Biomedical engineer / Ordering NPWT, Apligraf, etc. []  - 0 Emergency Hospital Admission (emergent condition) X- 1 10 Simple Discharge Coordination []  - 0 Complex (extensive) Discharge Coordination PROCESS - Special Needs []  - Pediatric / Minor Patient Management 0 []  - 0 Isolation Patient Management Jo Bird, Jo Bird. ([id number]) []  - 0 Hearing / Language / Visual special needs []  - 0 Assessment of Community assistance (transportation, D/C planning,  etc.) []  - 0 Additional assistance / Altered mentation []  - 0 Support Surface(s) Assessment (bed, cushion, seat, etc.) INTERVENTIONS - Wound Cleansing / Measurement X - Wound Imaging (photographs - any number of wounds) 1 5 []  - 0 Wound Tracing (instead of photographs) X- 1 5 Simple Wound Measurement - one wound []  - 0 Complex Wound Measurement -  multiple wounds X- 1 5 Simple Wound Cleansing - one wound []  - 0 Complex Wound Cleansing - multiple wounds INTERVENTIONS - Wound Dressings X - Small Wound Dressing one or multiple wounds 1 10 []  - 0 Medium Wound Dressing one or multiple wounds []  - 0 Large Wound Dressing one or multiple wounds []  - 0 Application of Medications - injection INTERVENTIONS - Miscellaneous []  - External ear exam 0 X- 1 5 Specimen Collection (cultures, biopsies, blood, body fluids, etc.) []  - 0 Specimen(s) / Culture(s) sent or taken to Lab for analysis []  - 0 Patient Transfer (multiple staff / Civil Service fast streamer / Similar devices) []  - 0 Simple Staple / Suture removal (25 or less) []  - 0 Complex Staple / Suture removal (26 or more) []  - 0 Hypo / Hyperglycemic Management (close monitor of Blood Glucose) []  - 0 Ankle / Brachial Index (ABI) - do not check if billed separately Has the patient been seen at the hospital within the last three years: Yes Total Score: 105 Level Of Care: New/Established - Level 3 Electronic Signature(s) Signed: 04/30/2017 5:00:26 PM By: Roger Shelter Entered By: Roger Shelter on 04/30/2017 10:57:32 Jo Bird ([id number]) -------------------------------------------------------------------------------- Encounter Discharge Information Details Patient Name: Jo Bird Date of Service: 04/30/2017 9:45 AM Medical Record Number: [id number] Patient Account Number: [id number] Date of Birth/Sex: 02-Jul-1929 (84 y.o. Female) Treating RN: Roger Shelter Primary Care Lurae Hornbrook: Ramonita Lab Other Clinician: Referring Eashan Schipani:  Maryella Shivers Treating Latonyia Lopata/Extender: Melburn Hake, HOYT Weeks in Treatment: 0 Encounter Discharge Information Items Discharge Pain Level: 0 Discharge Condition: Stable Ambulatory Status: Wheelchair Discharge Destination: Home Transportation: Private Auto Accompanied By: self Schedule Follow-up Appointment: Yes Medication Reconciliation completed and No provided to Patient/Care Jo Bird: Provided on Clinical Summary of Care: 04/30/2017 Form Type Recipient Paper Patient [facility] Electronic Signature(s) Signed: 04/30/2017 12:54:15 PM By: Montey Hora Entered By: Montey Hora on 04/30/2017 12:54:15 Jo Bird ([id number]) -------------------------------------------------------------------------------- Lower Extremity Assessment Details Patient Name: Jo Bird Date of Service: 04/30/2017 9:45 AM Medical Record Number: [id number] Patient Account Number: [id number] Date of Birth/Sex: 1929/12/28 (84 y.o. Female) Treating RN: Ahmed Prima Primary Care Juwuan Sedita: Ramonita Lab Other Clinician: Referring Kilea Mccarey: Maryella Shivers Treating Kimberlyn Quiocho/Extender: Melburn Hake, HOYT Weeks in Treatment: 0 Electronic Signature(s) Signed: 05/03/2017 4:32:30 PM By: Alric Quan Entered By: Alric Quan on 04/30/2017 10:22:01 Jo Bird, Jo Bird ([id number]) -------------------------------------------------------------------------------- Multi Wound Chart Details Patient Name: Jo Bird Date of Service: 04/30/2017 9:45 AM Medical Record Number: [id number] Patient Account Number: [id number] Date of Birth/Sex: [birth year] (84 y.o. Female) Treating RN: Roger Shelter Primary Care Clemma Johnsen: Ramonita Lab Other Clinician: Referring Marytza Grandpre: Maryella Shivers Treating Leydy Worthey/Extender: Melburn Hake, HOYT Weeks in Treatment: 0 Vital Signs Height(in): Pulse(bpm): 32 Weight(lbs): Blood Pressure(mmHg): 148/65 Body Mass Index(BMI): Temperature(F): 97.9 Respiratory  Rate 18 (breaths/min): Photos: [1:No Photos] [N/A:N/A] Wound Location: [1:Left Breast] [N/A:N/A] Wounding Event: [1:Gradually Appeared] [N/A:N/A] Primary Etiology: [1:Malignant Wound] [N/A:N/A] Comorbid History: [1:Sleep Apnea, Hypertension, Type II Diabetes, Osteoarthritis, Dementia, Received Radiation] [N/A:N/A] Date Acquired: [1:03/01/2017] [N/A:N/A] Weeks of Treatment: [1:0] [N/A:N/A] Wound Status: [1:Open] [N/A:N/A] Clustered Wound: [1:Yes] [N/A:N/A] Clustered Quantity: [1:2] [N/A:N/A] Measurements L x W x D [1:7.5x11.7x2.5] [N/A:N/A] (cm) Area (cm) : [1:68.919] [N/A:N/A] Volume (cm) : [1:172.297] [N/A:N/A] % Reduction in Area: [1:0.00%] [N/A:N/A] % Reduction in Volume: [1:0.00%] [N/A:N/A] Position 1 (o'clock): [1:12] Maximum Distance 1 (cm): [1:2.9] Starting Position 1 [1:10] (o'clock): Ending Position 1 [1:1] (o'clock): Maximum Distance 1 (cm): [1:1.6] Tunneling: [1:Yes] [N/A:N/A] Undermining: [1:Yes] [N/A:N/A] Classification: [1:Full Thickness With Exposed Support Structures] [N/A:N/A] Exudate Amount: [  1:Large] [N/A:N/A] Exudate Type: [1:Purulent] [N/A:N/A] Exudate Color: [1:yellow, brown, green] [N/A:N/A] Foul Odor After Cleansing: [1:Yes] [N/A:N/A] Odor Anticipated Due to [1:No] [N/A:N/A] Product Use: Wound Margin: [1:Distinct, outline attached] [N/A:N/A] Granulation Amount: Small (1-33%) N/A N/A Granulation Quality: Red N/A N/A Necrotic Amount: Large (67-100%) N/A N/A Necrotic Tissue: Eschar, Adherent Slough N/A N/A Exposed Structures: Fascia: Yes N/A N/A Fat Layer (Subcutaneous Tissue) Exposed: Yes Muscle: Yes Epithelialization: None N/A N/A Periwound Skin Texture: Scarring: Yes N/A N/A Periwound Skin Moisture: Maceration: Yes N/A N/A Periwound Skin Color: No Abnormalities Noted N/A N/A Temperature: No Abnormality N/A N/A Tenderness on Palpation: Yes N/A N/A Wound Preparation: Ulcer Cleansing: N/A N/A Rinsed/Irrigated with Saline Topical  Anesthetic Applied: Other: lidocaine 4% Treatment Notes Electronic Signature(s) Signed: 04/30/2017 5:00:26 PM By: Roger Shelter Entered By: Roger Shelter on 04/30/2017 10:44:11 Jo Bird, Jo Bird ([id number]) -------------------------------------------------------------------------------- Multi-Disciplinary Care Plan Details Patient Name: Jo Bird Date of Service: 04/30/2017 9:45 AM Medical Record Number: [id number] Patient Account Number: [id number] Date of Birth/Sex: [birth year] (84 y.o. Female) Treating RN: Roger Shelter Primary Care Yaroslav Gombos: Ramonita Lab Other Clinician: Referring Deem Marmol: Maryella Shivers Treating Tasheena Wambolt/Extender: Melburn Hake, HOYT Weeks in Treatment: 0 Active Inactive Electronic Signature(s) Signed: 04/30/2017 5:00:26 PM By: Roger Shelter Entered By: Roger Shelter on 04/30/2017 10:42:58 Jo Bird, Jo Bird ([id number]) -------------------------------------------------------------------------------- Pain Assessment Details Patient Name: Jo Bird Date of Service: 04/30/2017 9:45 AM Medical Record Number: [id number] Patient Account Number: [id number] Date of Birth/Sex: 1929/12/01 (84 y.o. Female) Treating RN: Carolyne Fiscal, Debi Primary Care Teandre Hamre: Ramonita Lab Other Clinician: Referring Kevontay Burks: Maryella Shivers Treating Amaris Garrette/Extender: Melburn Hake, HOYT Weeks in Treatment: 0 Active Problems Location of Pain Severity and Description of Pain Patient Has Paino Yes Site Locations Pain Location: Pain in Ulcers Rate the pain. Current Pain Level: 3 Character of Pain Describe the Pain: Aching Pain Management and Medication Current Pain Management: Notes Topical or injectable lidocaine is offered to patient for acute pain when surgical debridement is performed. If needed, Patient is instructed to use over the counter pain medication for the following 24-48 hours after debridement. Wound care MDs do not prescribed pain medications. Patient has  chronic pain or uncontrolled pain. Patient has been instructed to make an appointment with their Primary Care Physician for pain management. Electronic Signature(s) Signed: 05/03/2017 4:32:30 PM By: Alric Quan Entered By: Alric Quan on 04/30/2017 10:01:48 Jo Bird ([id number]) -------------------------------------------------------------------------------- Patient/Caregiver Education Details Patient Name: Jo Bird Date of Service: 04/30/2017 9:45 AM Medical Record Number: [id number] Patient Account Number: [id number] Date of Birth/Gender: [birth year] (84 y.o. Female) Treating RN: Montey Hora Primary Care Physician: Ramonita Lab Other Clinician: Referring Physician: Maryella Shivers Treating Physician/Extender: Sharalyn Ink in Treatment: 0 Education Assessment Education Provided To: Caregiver SNF nurses via written orders Education Topics Provided Wound/Skin Impairment: Handouts: Other: signed wound care orders Methods: Printed Electronic Signature(s) Signed: 04/30/2017 5:08:18 PM By: Montey Hora Entered By: Montey Hora on 04/30/2017 12:54:50 Jo Bird ([id number]) -------------------------------------------------------------------------------- Wound Assessment Details Patient Name: Jo Bird Date of Service: 04/30/2017 9:45 AM Medical Record Number: [id number] Patient Account Number: [id number] Date of Birth/Sex: 06-09-1929 (84 y.o. Female) Treating RN: Carolyne Fiscal, Debi Primary Care Brandom Kerwin: Ramonita Lab Other Clinician: Referring Jaclyn Andy: Maryella Shivers Treating Cindra Austad/Extender: Melburn Hake, HOYT Weeks in Treatment: 0 Wound Status Wound Number: 1 Primary Malignant Wound Etiology: Wound Location: Left Breast Wound Open Wounding Event: Gradually Appeared Status: Date Acquired: 03/01/2017 Comorbid Sleep Apnea, Hypertension, Type II Diabetes, Weeks Of Treatment: 0 History: Osteoarthritis, Dementia, Received Radiation Clustered  Wound:  Yes Photos Photo Uploaded By: Roger Shelter on 04/30/2017 16:46:15 Wound Measurements Length: (cm) 7.5 Width: (cm) 11.7 Depth: (cm) 2.5 Clustered Quantity: 2 Area: (cm) 68.919 Volume: (cm) 172.297 % Reduction in Area: 0% % Reduction in Volume: 0% Epithelialization: None Tunneling: Yes Position (o'clock): 12 Maximum Distance: (cm) 2.9 Undermining: Yes Starting Position (o'clock): 10 Ending Position (o'clock): 1 Maximum Distance: (cm) 1.6 Wound Description Full Thickness With Exposed Support Classification: Structures Wound Margin: Distinct, outline attached Exudate Large Amount: Exudate Type: Purulent Exudate Color: yellow, brown, green Foul Odor After Cleansing: Yes Due to Product Use: No Slough/Fibrino Yes Wound Bed Jo Bird, MCPHEARSON. ([id number]) Granulation Amount: Small (1-33%) Exposed Structure Granulation Quality: Red Fascia Exposed: Yes Necrotic Amount: Large (67-100%) Fat Layer (Subcutaneous Tissue) Exposed: Yes Necrotic Quality: Eschar, Adherent Slough Muscle Exposed: Yes Necrosis of Muscle: Yes Periwound Skin Texture Texture Color No Abnormalities Noted: No No Abnormalities Noted: No Scarring: Yes Temperature / Pain Moisture Temperature: No Abnormality No Abnormalities Noted: No Tenderness on Palpation: Yes Maceration: Yes Wound Preparation Ulcer Cleansing: Rinsed/Irrigated with Saline Topical Anesthetic Applied: Other: lidocaine 4%, Electronic Signature(s) Signed: 05/03/2017 4:32:30 PM By: Alric Quan Entered By: Alric Quan on 04/30/2017 10:33:50 Jo Bird, Jo Bird ([id number]) -------------------------------------------------------------------------------- Vitals Details Patient Name: Jo Bird Date of Service: 04/30/2017 9:45 AM Medical Record Number: [id number] Patient Account Number: [id number] Date of Birth/Sex: 12-30-1929 (84 y.o. Female) Treating RN: Carolyne Fiscal, Debi Primary Care Marckus Hanover: Ramonita Lab Other  Clinician: Referring Ismerai Bin: Maryella Shivers Treating Immanuel Fedak/Extender: Melburn Hake, HOYT Weeks in Treatment: 0 Vital Signs Time Taken: 10:09 Temperature (F): 97.9 Pulse (bpm): 62 Respiratory Rate (breaths/min): 18 Blood Pressure (mmHg): 148/65 Reference Range: 80 - 120 mg / dl Electronic Signature(s) Signed: 05/03/2017 4:32:30 PM By: Alric Quan Entered By: Alric Quan on 04/30/2017 10:10:00

## 2017-05-04 NOTE — Progress Notes (Addendum)
JIAYI, LENGACHER ([id number]) Visit Report for 04/30/2017 Chief Complaint Document Details Patient Name: Jo Bird, Jo Bird Date of Service: 04/30/2017 9:45 AM Medical Record Number: [id number] Patient Account Number: [id number] Date of Birth/Sex: 11-Jun-1929 (84 y.o. 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 Female) Treating RN: Roger Shelter Primary Care Provider: Ramonita Lab Other Clinician: Referring Provider: Maryella Shivers Treating Provider/Extender: Melburn Hake, Tyan Lasure Weeks in Treatment: 0 History of Present Illness Associated Signs and Symptoms: Patient has a history of dementia, diabetes mellitus type II, chronic kidney disease stage III, and hypertension. HPI Description: 04/30/17 on evaluation today patient presents initially to our clinic concerning a left chest wall ulcer which is the site of a previous mastectomy. 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 Unfortunately this does not seem to have completely resolved and patient has a significant ulcer in the region of the left chest wall where the breasts would've been that has been nonhealing. Subsequently she has significant undermining in regard to this wound in the cephalad portion of the wound. There is also a separate but connected  wounds at roughly the 2 o'clock location which seems to tunnel back down into the main wound bed. Currently patient is under palliative care although she is not under hospice care at this time. She does have discomfort in regard to this ulcer unfortunately. Up to this point it appears that the patient has been on various medications including Santyl, taking solution, and upon evaluation today Hydrofera Blue Dressing have been placed to the wound bed. Unfortunately these do not seem to be expecting significant change in the patient. Electronic Signature(s) Signed: 05/07/2017 8:27:59 AM By: Worthy Keeler PA-C Entered By: Worthy Keeler on 05/07/2017 08:24:54 ELISABETTA, MISHRA ([id number]) -------------------------------------------------------------------------------- Physical Exam Details Patient Name: Jo Bird Date of Service: 04/30/2017 9:45 AM Medical Record Number: [id number] Patient Account Number: [id number] Date of Birth/Sex: 1929-08-31 (84 y.o. Female) Treating RN: Roger Shelter Primary Care Provider: Ramonita Lab Other Clinician: Referring Provider: Maryella Shivers Treating Provider/Extender: Melburn Hake, Nataley Bahri Weeks in Treatment: 0 Constitutional patient is hypertensive.. pulse regular and within target range for patient.Marland Kitchen respirations regular, non-labored and within target range for patient.Marland Kitchen temperature within target range for patient.. Chronically ill appearing but in no apparent acute distress. Eyes conjunctiva clear no eyelid edema noted. pupils equal round and reactive to light and accommodation. Ears, Nose, Mouth, and Throat no gross abnormality of ear auricles or external auditory canals. normal hearing noted during conversation. mucus membranes moist. Respiratory normal breathing without difficulty. clear to auscultation bilaterally. Cardiovascular regular rate and rhythm with normal S1, S2. no clubbing, cyanosis, significant edema, <3 sec cap  refill. Gastrointestinal (GI) soft, non-tender, non-distended, +BS. no ventral hernia noted. Musculoskeletal Patient unable to walk without assistance. Psychiatric Patient is not able to cooperate in decision making regarding care. Patient is oriented to person and place. pleasant and cooperative. Notes Patient's wound on evaluation today is significantly slough covered unfortunately it does appear to be painful with probing and palpation. Obviously  due to the history of cancer no debridement was performed at this point in time. With that being said it does appear that multiple dressings have been used in the past including Santyl among other things such as Dakin's although [employer] Dressing on the wound during evaluation today. Electronic Signature(s) Signed: 05/07/2017 8:27:59 AM By: Worthy Keeler PA-C Entered By: Worthy Keeler on 05/07/2017 08:26:30 ARMENTA, ERSKIN ([id number]) -------------------------------------------------------------------------------- Physician Orders Details Patient Name: Jo Bird Date of Service: 04/30/2017 9:45 AM Medical Record Number: [id number] Patient Account Number: [id number] Date of Birth/Sex: May 04, 1929 (84 y.o. Female) Treating RN: Roger Shelter Primary Care Provider: Ramonita Lab Other Clinician: Referring Provider: Maryella Shivers Treating Provider/Extender: Melburn Hake, Caytlin Better Weeks in Treatment: 0 Verbal / Phone Orders: No Diagnosis Coding Wound Cleansing Wound #1 Left Breast o Cleanse wound with mild soap and water Primary Wound Dressing Wound #1 Left Breast o Other: - packing 1/4 inch gauze soaked with dakins packed into wound at the 12 o'clock to 1 o'clock area. crush flaygl 500mg  tab and mix with hydrogel to make paste, place in to wound bed. Cover with saturated dakins gauze. apply ABD pad and secure with tape. Secondary Dressing Wound #1 Left Breast o ABD pad o Other - may apply carbo flex over the dakins  soaked pad to assist with odor control Dressing Change Frequency Wound #1 Left Breast o Change dressing every day. Follow-up Appointments Wound #1 Left Breast o Return Appointment in 1 week. Laboratory o Bacteria identified in Wound by Culture (MICRO) - left breast oooo LOINC Code: 5573-2 oooo Convenience Name: Wound culture routine Electronic Signature(s) Signed: 04/30/2017 5:00:26 PM By: Roger Shelter Signed: 05/01/2017 8:40:52 AM By: Worthy Keeler PA-C Entered By: Roger Shelter on 04/30/2017 10:55:00 SHARRONDA, SCHWEERS ([id number]) -------------------------------------------------------------------------------- Problem List Details Patient Name: Jo Bird Date of Service: 04/30/2017 9:45 AM Medical Record Number: [id number] Patient Account Number: [id number] Date of Birth/Sex: 05-23-1929 (84 y.o. 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/01/2017 Yes exposed E11.622 Type 2 diabetes mellitus with other skin ulcer 05/01/2017 Yes F03.90 Unspecified dementia without behavioral disturbance 05/01/2017 Yes I10 Essential (primary) hypertension 05/01/2017 Yes Inactive Problems Resolved Problems Electronic Signature(s) Signed: 05/01/2017 8:40:52 AM By: Worthy Keeler PA-C Entered By: Worthy Keeler on 05/01/2017 08:39:18 Jo Bird ([id number]) -------------------------------------------------------------------------------- Progress Note Details Patient Name: Jo Bird Date of Service: 04/30/2017 9:45 AM Medical Record Number: [id number] Patient Account Number:  [id number] Date of Birth/Sex: 1929/11/19 (84 y.o. 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 04/30/17 on evaluation today patient presents initially to our clinic concerning a left chest wall ulcer which is the site of a previous mastectomy. 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 Unfortunately this does not seem to have completely resolved and  patient has a significant ulcer in the region of the left chest wall where the breasts would've been that has been nonhealing. Subsequently she has significant undermining in regard to this wound in the cephalad portion of the wound. There is also a separate but connected wounds at roughly the 2 o'clock location which seems to tunnel back down into the main wound bed. Currently patient is under palliative care although she is not under hospice care at this time. She does have discomfort in regard to this ulcer unfortunately. Up to this point it appears that the patient has been on various medications including Santyl, taking solution, and upon evaluation today Hydrofera Blue Dressing have been placed to the wound bed. Unfortunately these do not seem to be expecting significant change in the patient. Wound History Patient presents with 1 open wound that has been present for approximately unknown. Patient has been treating wound in the following manner: dakins and santyl. Laboratory tests have not been performed in the last  month. Patient reportedly has not tested positive for an antibiotic resistant organism. Patient reportedly has not tested positive for osteomyelitis. Patient reportedly has not had testing performed to evaluate circulation in the legs. Patient History Information obtained from Patient. Allergies penicillin, pioglitazone Social History Never smoker, Marital Status - Divorced, Alcohol Use - Never, Drug Use - No History, Caffeine Use - Never. Medical History Respiratory Patient has history of Sleep Apnea Cardiovascular Patient has history of Hypertension Endocrine Patient has history of Type II Diabetes Musculoskeletal Patient has history of Osteoarthritis Neurologic BENNETT, VANSCYOC. ([id number]) Patient has history of Dementia Oncologic Patient has history of Received Radiation Patient is treated with Insulin. Review of Systems (ROS) Constitutional Symptoms (General Health) The patient has no complaints or symptoms. Eyes The patient has no complaints or symptoms. Ear/Nose/Mouth/Throat HOH Hematologic/Lymphatic hypercalcemia hx Cardiovascular heart murmur hyperlipidemia Gastrointestinal ckd stage 3 Neurologic acute encephalopathy hx Oncologic angisarcoma left breast Psychiatric The patient has no complaints or symptoms. General Notes: unable to answer all questions because of mental status Objective Constitutional patient is hypertensive.. pulse regular and within target range for patient.Marland Kitchen respirations regular, non-labored and within target range for patient.Marland Kitchen temperature within target range for patient.. Chronically ill appearing but in no apparent acute distress. Vitals Time Taken: 10:09 AM, Temperature: 97.9 F, Pulse: 62 bpm, Respiratory Rate: 18 breaths/min, Blood Pressure: 148/65 mmHg. Eyes conjunctiva clear no eyelid edema noted. pupils equal round and reactive to light and accommodation. Ears, Nose, Mouth, and Throat no gross abnormality of ear auricles or  external auditory canals. normal hearing noted during conversation. mucus membranes moist. Respiratory normal breathing without difficulty. clear to auscultation bilaterally. Cardiovascular regular rate and rhythm with normal S1, S2. no clubbing, cyanosis, significant edema, Gastrointestinal (GI) soft, non-tender, non-distended, +BS. no ventral hernia noted. LASHA, ECHEVERRIA ([id number]) Musculoskeletal Patient unable to walk without assistance. Psychiatric Patient is not able to cooperate in decision making regarding care. Patient is oriented to person and place. pleasant and cooperative. General Notes: Patient's wound on evaluation today is significantly slough covered unfortunately it does appear to be painful with probing and palpation. Obviously due to the history of cancer no debridement was performed at this point in time. With that being said it does appear that multiple dressings have been used in the past including Santyl among other things such as Dakin's although [employer] Dressing on the wound during evaluation today. Integumentary (Hair, Skin) Wound #1 status is Open. Original cause of wound was Gradually Appeared. The wound  is located on the Left Breast. The wound measures 7.5cm length x 11.7cm width x 2.5cm depth; 68.919cm^2 area and 172.297cm^3 volume. There is muscle, Fat Layer (Subcutaneous Tissue) Exposed, and fascia exposed. Tunneling has been noted at 12:00 with a maximum distance of 2.9cm. Undermining begins at 10:00 and ends at 1:00 with a maximum distance of 1.6cm. There is a large amount of purulent drainage noted. Foul odor after cleansing was noted. The wound margin is distinct with the outline attached to the wound base. There is small (1-33%) red granulation within the wound bed. There is a large (67-100%) amount of necrotic tissue within the wound bed including Eschar, Adherent Slough and Necrosis of Muscle. The periwound skin appearance exhibited:  Scarring, Maceration. Periwound temperature was noted as No Abnormality. The periwound has tenderness on palpation. Assessment Active Problems ICD-10 C50.912 - Malignant neoplasm of unspecified site of left female breast L98.492 - Non-pressure chronic ulcer of skin of other sites with fat layer exposed E11.622 - Type 2 diabetes mellitus with other skin ulcer F03.90 - Unspecified dementia without behavioral disturbance I10 - Essential (primary) hypertension Plan Wound Cleansing: Wound #1 Left Breast: Cleanse wound with mild soap and water Primary Wound Dressing: Wound #1 Left Breast: Other: - packing 1/4 inch gauze soaked with dakins packed into wound at the 12 o'clock to 1 o'clock area. crush flaygl 500mg  tab and mix with hydrogel to make paste, place in to wound bed. Cover with saturated dakins gauze. apply ABD pad and secure with tape. Secondary Dressing: Wound #1 Left Breast: ABD pad Other - may apply carbo flex over the dakins soaked pad to assist with odor control MARIAHA, ELLINGTON M. ([id number]) Dressing Change Frequency: Wound #1 Left Breast: Change dressing every day. Follow-up Appointments: Wound #1 Left Breast: Return Appointment in 1 week. Laboratory ordered were: Wound culture routine - left breast I am going to recommend that we initiate the above wound care orders for the next week. I also did perform a wound culture the left breast due to the fact that it appears there may be infection at this point in time. The results I will subsequently prescribe antibiotics for the patient. The meantime we will also initiate the Dakin's with the crushed 500 mg Flagyl currently. Please see above for specific wound care orders. We will see patient for re-evaluation in 1 week(s) here in the clinic. If anything worsens or changes patient will contact our office for additional recommendations. Electronic Signature(s) Signed: 05/07/2017 8:27:59 AM By: Worthy Keeler PA-C Entered By:  Worthy Keeler on 05/07/2017 08:27:29 KALIJAH, WESTFALL ([id number]) -------------------------------------------------------------------------------- ROS/PFSH Details Patient Name: Jo Bird Date of Service: 04/30/2017 9:45 AM Medical Record Number: [id number] Patient Account Number: [id number] Date of Birth/Sex: 1929/11/30 (84 y.o. Female) Treating RN: Carolyne Fiscal, Debi Primary Care Provider: Ramonita Lab Other Clinician: Referring Provider: Maryella Shivers Treating Provider/Extender: Melburn Hake, Aliece Honold Weeks in Treatment: 0 Information Obtained From Patient Wound History Do you currently have one or more open woundso Yes How many open wounds do you currently haveo 1 Approximately how long have you had your woundso unknown How have you been treating your wound(s) until nowo dakins and santyl Has your wound(s) ever healed and then re-openedo No Have you had any lab work done in the past montho No Have you tested positive for an antibiotic resistant organism (MRSA, VRE)o No Have you tested positive for osteomyelitis (bone infection)o No Have you had any tests for circulation on your legso No Constitutional Symptoms (General Health)  Complaints and Symptoms: No Complaints or Symptoms Eyes Complaints and Symptoms: No Complaints or Symptoms Ear/Nose/Mouth/Throat Complaints and Symptoms: Review of System Notes: HOH Hematologic/Lymphatic Complaints and Symptoms: Review of System Notes: hypercalcemia hx Respiratory Medical History: Positive for: Sleep Apnea Cardiovascular Complaints and Symptoms: Review of System Notes: heart murmur hyperlipidemia Medical History: Positive for: Hypertension GENIYAH, EISCHEID. ([id number]) Gastrointestinal Complaints and Symptoms: Review of System Notes: ckd stage 3 Endocrine Medical History: Positive for: Type II Diabetes Treated with: Insulin Musculoskeletal Medical History: Positive for: Osteoarthritis Neurologic Complaints and  Symptoms: Review of System Notes: acute encephalopathy hx Medical History: Positive for: Dementia Oncologic Complaints and Symptoms: Review of System Notes: angisarcoma left breast Medical History: Positive for: Received Radiation Psychiatric Complaints and Symptoms: No Complaints or Symptoms Immunizations Pneumococcal Vaccine: Received Pneumococcal Vaccination: No Implantable Devices Family and Social History Never smoker; Marital Status - Divorced; Alcohol Use: Never; Drug Use: No History; Caffeine Use: Never; Financial Concerns: No; Food, Clothing or Shelter Needs: No; Support System Lacking: No; Transportation Concerns: No Notes unable to answer all questions because of mental status Electronic Signature(s) Signed: 05/01/2017 8:40:52 AM By: Worthy Keeler PA-C Signed: 05/03/2017 4:32:30 PM By: Alric Quan Entered By: Alric Quan on 04/30/2017 10:19:49 KERRIE, TIMM ([id number]) MARLIE, KUENNEN ([id number]) -------------------------------------------------------------------------------- SuperBill Details Patient Name: Jo Bird Date of Service: 04/30/2017 Medical Record Number: [id number] Patient Account Number: [id number] Date of Birth/Sex: [birth year] (84 y.o. Female) Treating RN: Roger Shelter Primary Care Provider: Ramonita Lab Other Clinician: Referring Provider: Maryella Shivers Treating Provider/Extender: Melburn Hake, Nobie Alleyne Weeks in Treatment: 0 Diagnosis Coding ICD-10 Codes Code Description C50.912 Malignant neoplasm of unspecified site of left female breast L98.492 Non-pressure chronic ulcer of skin of other sites with fat layer exposed E11.622 Type 2 diabetes mellitus with other skin ulcer F03.90 Unspecified dementia without behavioral disturbance I10 Essential (primary) hypertension Facility Procedures CPT4 Code: [id number] Description: 99213 - WOUND CARE VISIT-LEV 3 EST PT Modifier: Quantity: 1 Physician Procedures CPT4 Code:  6389373 Description: 42876 - WC PHYS LEVEL 4 - NEW PT ICD-10 Diagnosis Description C50.912 Malignant neoplasm of unspecified site of left female breast L98.492 Non-pressure chronic ulcer of skin of other sites with fat l E11.622 Type 2 diabetes mellitus with  other skin ulcer F03.90 Unspecified dementia without behavioral disturbance Modifier: ayer exposed Quantity: 1 Electronic Signature(s) Signed: 05/01/2017 8:40:52 AM By: Worthy Keeler PA-C Entered By: Worthy Keeler on 05/01/2017 08:40:11

## 2017-05-07 ENCOUNTER — Ambulatory Visit: Payer: Medicare Other | Admitting: Physician Assistant

## 2017-10-30 DEATH — deceased

## 2018-11-14 IMAGING — CT CT HEAD W/O CM
3 of 4 series · 16 of 47 positions shown, 19 images · non-contrast
Comparison: 03/13/2014

CLINICAL DATA: Increasing confusion

EXAM:
CT HEAD WITHOUT CONTRAST
TECHNIQUE: Contiguous axial images were obtained from the base of the skull
through the vertex without intravenous contrast.

[Series 2: head wo · axial · 0.40mm/px · z∈[+277,+397]mm · 10 of 30 slices shown, 13 images]
[im 3/30  brain]
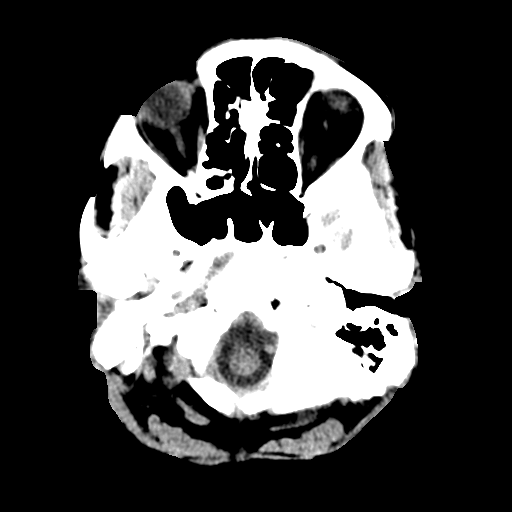
[im 3/30  bone]
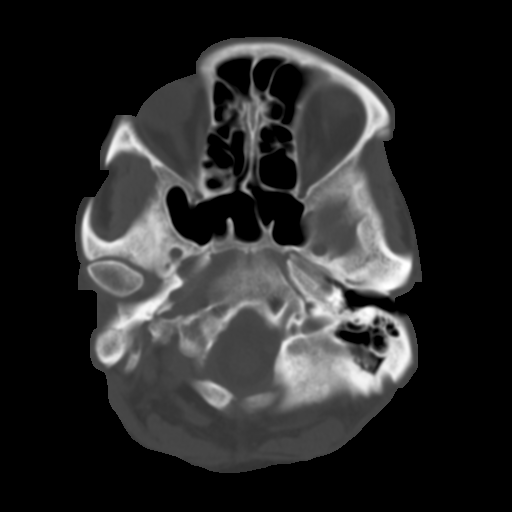
[im 5/30  brain]
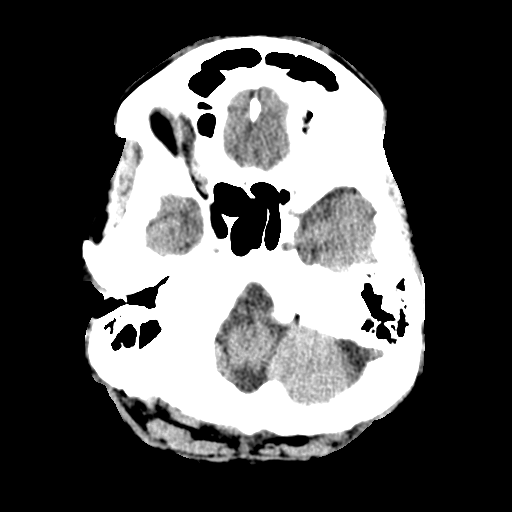
[im 9/30  brain]
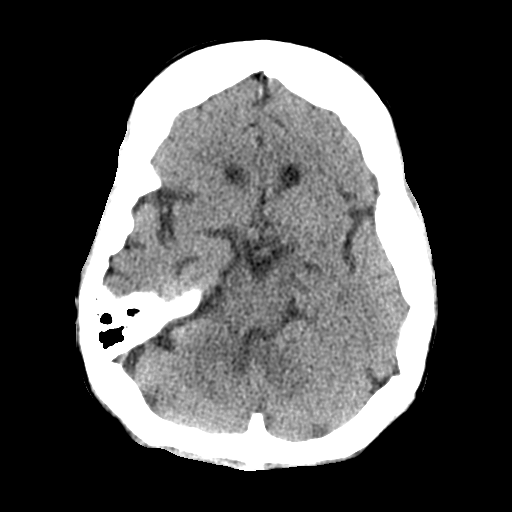
[im 11/30  brain]
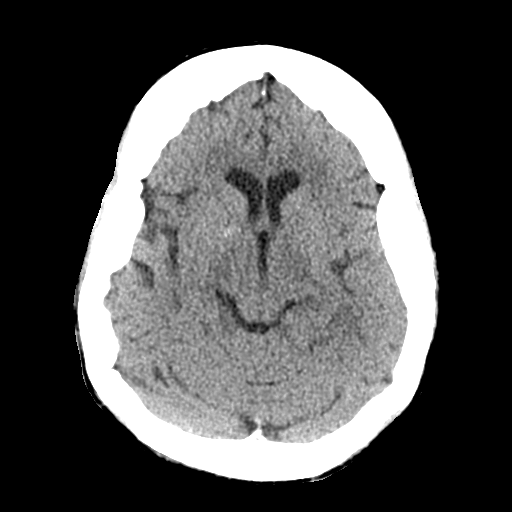
[im 13/30  brain]
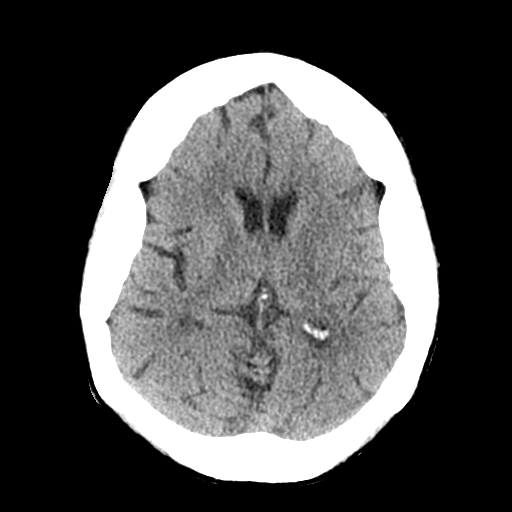
[im 13/30  bone]
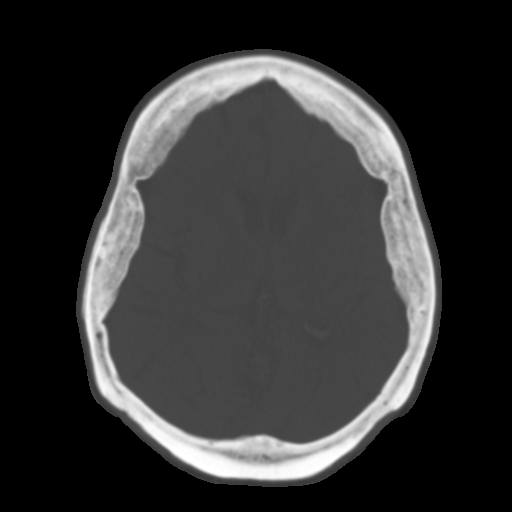
[im 17/30  brain]
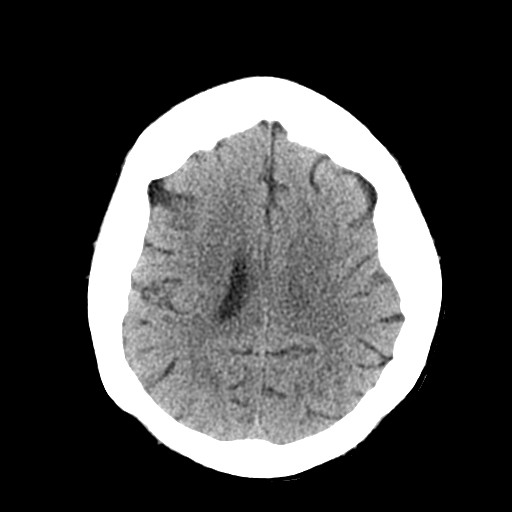
[im 19/30  brain]
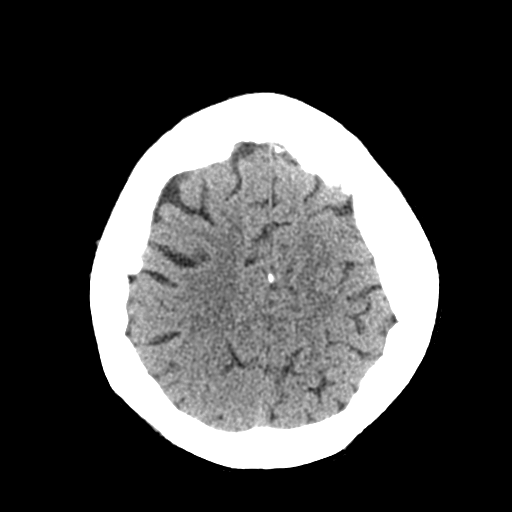
[im 21/30  brain]
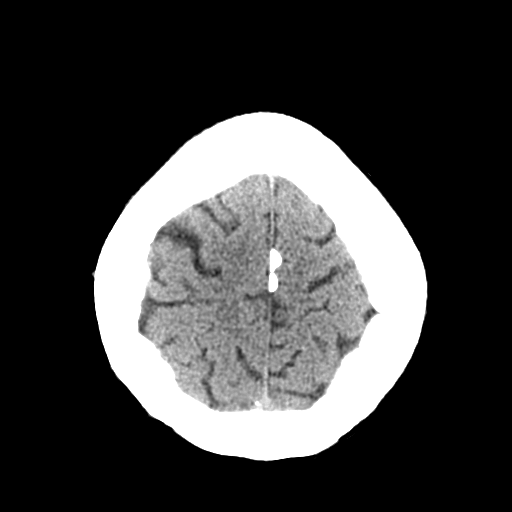
[im 25/30  brain]
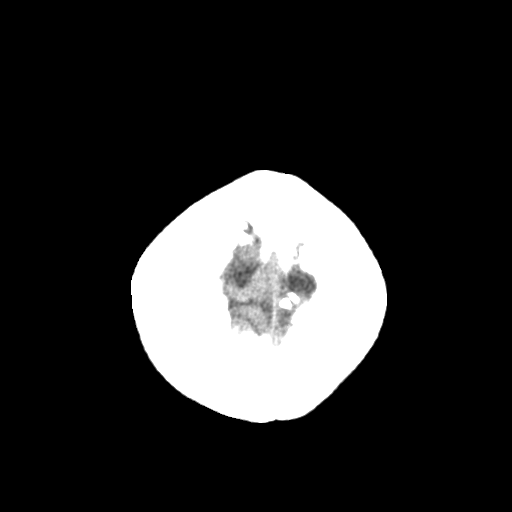
[im 25/30  bone]
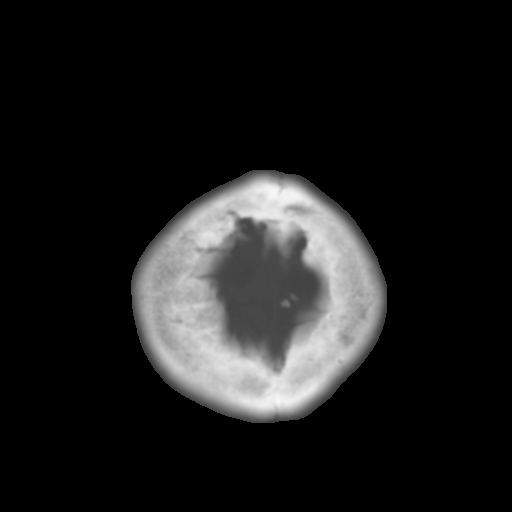
[im 27/30  brain]
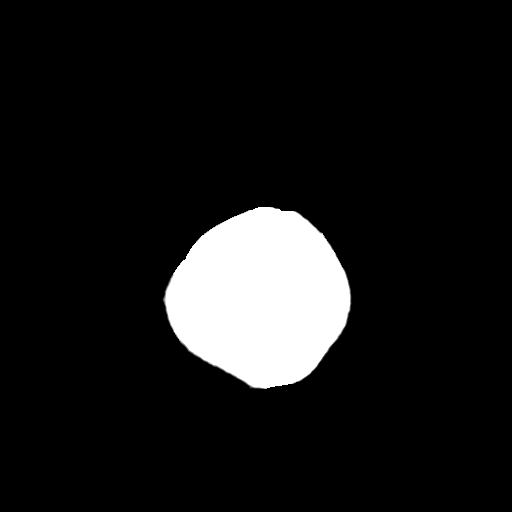

[Series 6: coronal soft tissue · coronal · 0.31mm/px · 3 of 65 slices shown]
[im 22/65  brain]
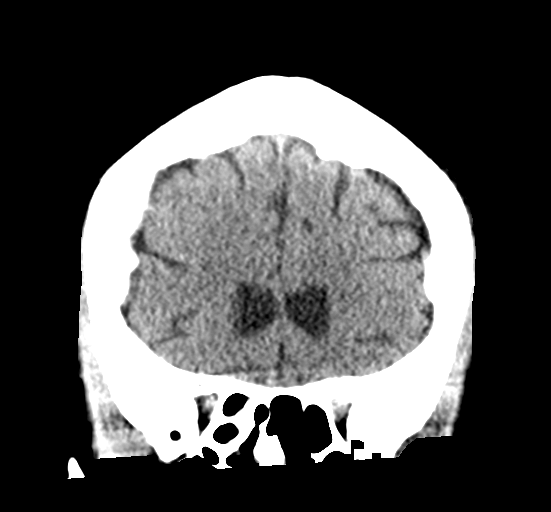
[im 29/65  brain]
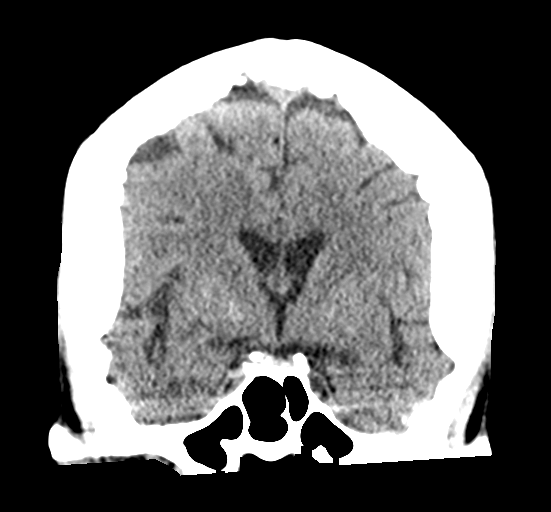
[im 36/65  brain]
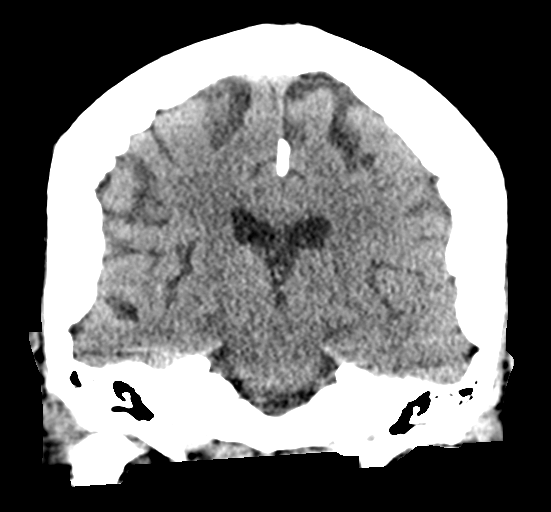

[Series 7: sagittal soft tissue · sagittal · 0.31mm/px · 3 of 51 slices shown]
[im 17/51  brain]
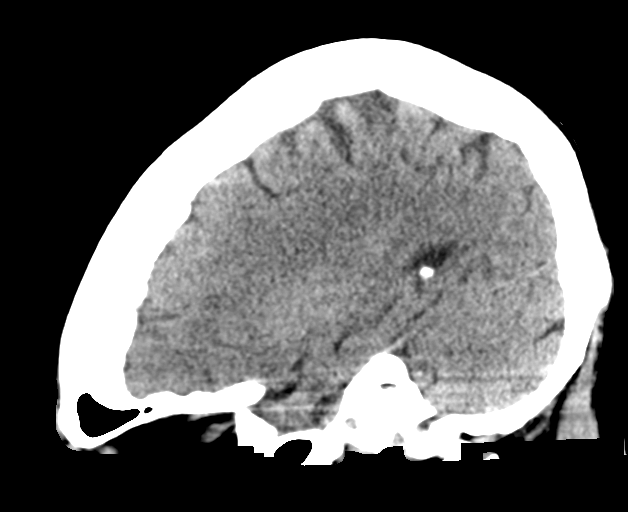
[im 26/51  brain]
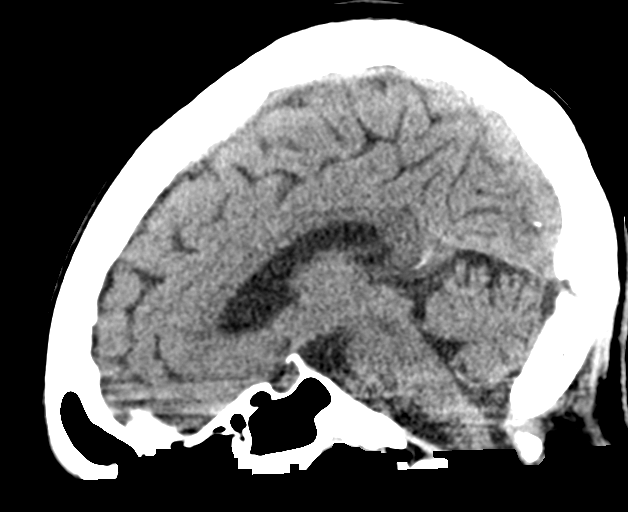
[im 34/51  brain]
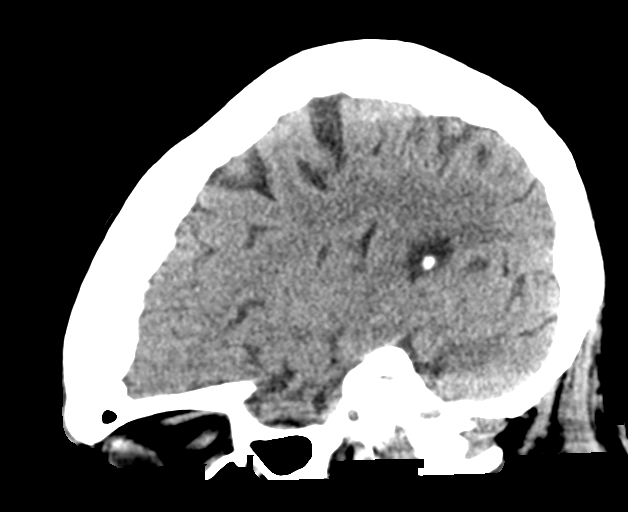

[16 of 47 positions shown; findings below may reference images not displayed]

FINDINGS: Brain: No evidence of acute infarction, hemorrhage, hydrocephalus,
extra-axial collection or mass lesion/mass effect. Mild age-related
atrophy is again seen.

Vascular: No hyperdense vessel or unexpected calcification.

Skull: Normal. Negative for fracture or focal lesion.

Sinuses/Orbits: No acute finding.

Other: None.
IMPRESSION: Normal CT for age.
